# Patient Record
Sex: Female | Born: 1995
Health system: Southern US, Community
[De-identification: ages and names within clinical notes are randomized; demographics above are authoritative.]

## PROBLEM LIST (undated history)

## (undated) DIAGNOSIS — N946 Dysmenorrhea, unspecified: Secondary | ICD-10-CM

## (undated) DIAGNOSIS — L709 Acne, unspecified: Secondary | ICD-10-CM

## (undated) DIAGNOSIS — R011 Cardiac murmur, unspecified: Secondary | ICD-10-CM

## (undated) DIAGNOSIS — Z23 Encounter for immunization: Secondary | ICD-10-CM

## (undated) DIAGNOSIS — L309 Dermatitis, unspecified: Secondary | ICD-10-CM

## (undated) HISTORY — PX: NASAL SEPTUM SURGERY: SHX37

## (undated) HISTORY — DX: Dermatitis, unspecified: L30.9

## (undated) HISTORY — DX: Acne, unspecified: L70.9

## (undated) HISTORY — DX: Cardiac murmur, unspecified: R01.1

## (undated) HISTORY — DX: Encounter for immunization: Z23

## (undated) HISTORY — DX: Dysmenorrhea, unspecified: N94.6

---
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
## 2016-08-01 ENCOUNTER — Encounter: Payer: Self-pay | Admitting: Certified Nurse Midwife

## 2016-08-01 ENCOUNTER — Ambulatory Visit (INDEPENDENT_AMBULATORY_CARE_PROVIDER_SITE_OTHER): Payer: BLUE CROSS/BLUE SHIELD | Admitting: Certified Nurse Midwife

## 2016-08-01 VITALS — BP 128/70 | HR 106 | Ht 64.0 in | Wt 147.0 lb

## 2016-08-01 DIAGNOSIS — Z113 Encounter for screening for infections with a predominantly sexual mode of transmission: Secondary | ICD-10-CM | POA: Diagnosis not present

## 2016-08-01 DIAGNOSIS — Z975 Presence of (intrauterine) contraceptive device: Secondary | ICD-10-CM

## 2016-08-01 DIAGNOSIS — Z3049 Encounter for surveillance of other contraceptives: Secondary | ICD-10-CM

## 2016-08-01 DIAGNOSIS — Z01419 Encounter for gynecological examination (general) (routine) without abnormal findings: Secondary | ICD-10-CM | POA: Diagnosis not present

## 2016-08-01 NOTE — Progress Notes (Signed)
Gynecology Annual Exam  PCP: No PCP Per Patient  Chief Complaint:  Chief Complaint  Patient presents with  . Gynecologic Exam    History of Present Illness:   Wendy Garrett presents today for her annual exam. She is a 21 year old Caucasian/White female, G 0 P 0 0 0 0, whose LMP was . Her menses are absent She spots for one day irregularly since her Wendy Garrett IUD was inserted. 12/10/2015. She does have  Dysmenorrhea which is relieved with a heating pad or Aleve. The patient's past medical history is notable for a history of Cardiac Murmur and Dysmenorrhea. Since her last annual GYN exam dated 04/16/2015 , she has had no significant changes in her health history. She reports having to urinate frequently at night, but she drinks fluids up until she goes to bed. She has been having difficulty sleeping when she gets off working night shift.  She is not currently sexually active. She broke up with her boyfriend earlier this year.  She denies any history of STDs.  The patient has not had a pap smear due to her age.  Mammogram is not applicable. There is no family history of breast cancer. There is no family history of ovarian cancer. The patient does not do monthly self breast exams.  The patient does not smoke.  The patient does not drink alcohol.  The patient does not use illegal drugs.  The patient exercises occasionally. She has a BMI of 25.23kg/m2 (08/01/2016).  The patient does get adequate calcium in her diet.  She has completed the Gardasil vaccine series.      Review of Systems: Review of Systems  Constitutional: Negative for chills, fever and weight loss.       +weight gain  HENT: Negative for congestion, sinus pain and sore throat.   Eyes: Negative for blurred vision and pain.  Respiratory: Negative for hemoptysis, shortness of breath and wheezing.   Cardiovascular: Negative for chest pain, palpitations and leg swelling.  Gastrointestinal: Negative for abdominal pain, blood in  stool, diarrhea, heartburn, nausea and vomiting.  Genitourinary: Negative for dysuria, frequency, hematuria and urgency.       Positive for spotting irregularly, nocturia.  Musculoskeletal: Negative for back pain, joint pain and myalgias.  Skin: Negative for itching and rash.  Neurological: Negative for dizziness, tingling and headaches.  Endo/Heme/Allergies: Negative for environmental allergies and polydipsia. Does not bruise/bleed easily.       Negative for hirsutism   Psychiatric/Behavioral: Negative for depression. The patient has insomnia. The patient is not nervous/anxious.     Past Medical History:  Past Medical History:  Diagnosis Date  . Cardiac murmur   . Dysmenorrhea   . Vaccine for human papilloma virus (HPV) types 6, 11, 16, and 18 administered    series completed    Past Surgical History:  Past Surgical History:  Procedure Laterality Date  . NASAL SEPTUM SURGERY       Family History:  Family History  Problem Relation Age of Onset  . Bladder Cancer Maternal Grandmother   . Heart disease Maternal Grandfather   . Hypercholesterolemia Maternal Grandfather   . Hypertension Maternal Grandfather     Social History:  Social History   Social History  . Marital status: Single    Spouse name: N/A  . Number of children: N/A  . Years of education: N/A   Occupational History  . CNA    Social History Main Topics  . Smoking status: Never Smoker  .  Smokeless tobacco: Never Used  . Alcohol use No  . Drug use: No  . Sexual activity: Not Currently    Birth control/ protection: IUD   Other Topics Concern  . Not on file   Social History Narrative  . No narrative on file    Allergies:  No Known Allergies  Medications: Prior to Admission medications   Medication Sig Start Date End Date Taking? Authorizing Provider  Levonorgestrel (KYLEENA) 19.5 MG IUD by Intrauterine route.    Historical Provider, MD    Weight loss gummy  Physical Exam Vitals: Blood  pressure 128/70, pulse (!) 106, height  (1.626 m), weight 147 lb (66.7 kg), BMI 25.23 kg/m2, last menstrual period 07/23/2016.  General: WF in NAD, well nourished, well developed HEENT: normocephalic, anicteric Thyroid: no enlargement, no palpable nodules Pulmonary: No increased work of breathing, CTAB Cardiovascular: RRR without murmur Breast: Breast symmetrical, no tenderness, no palpable nodules or masses, no skin or nipple retraction present, no nipple discharge.  No axillary, infraclavicular or supraclavicular lymphadenopathy. Abdomen: soft, non-tender, non-distended.  Umbilicus without lesions.  No hepatomegaly or masses palpable. No evidence of hernia  Genitourinary:  External: Normal external female genitalia.  Normal urethral meatus, normal Bartholin's and Skene's glands.    Vagina: no lesions or tenderness    Cervix: no lesions, no bleeding, IUD strings present  Uterus: AF, NSSC, mobile, NT  Adnexa: ovaries non-enlarged, no adnexal masses  Rectal: deferred  Lymphatic: no evidence of inguinal lymphadenopathy Extremities: no edema, erythema, or tenderness Neurologic: Grossly intact Psychiatric: mood appropriate, affect full     Assessment: 21 y.o. well woman exam IUD in place  Plan:   1) Taught self breast exam. Recommend doing self breast exams monthly  2) STI screening was offered and accepted. Aptima done  3) Start Pap smears at age 78  4) Routine healthcare maintenance including cholesterol, diabetes screening discussed Declines   Farrel Conners, CNM

## 2016-08-02 ENCOUNTER — Encounter: Payer: Self-pay | Admitting: Certified Nurse Midwife

## 2016-08-02 DIAGNOSIS — Z975 Presence of (intrauterine) contraceptive device: Secondary | ICD-10-CM | POA: Insufficient documentation

## 2016-08-04 LAB — CHLAMYDIA/GONOCOCCUS/TRICHOMONAS, NAA
Chlamydia by NAA: NEGATIVE
GONOCOCCUS BY NAA: NEGATIVE
TRICH VAG BY NAA: NEGATIVE

## 2016-09-23 DIAGNOSIS — J301 Allergic rhinitis due to pollen: Secondary | ICD-10-CM | POA: Insufficient documentation

## 2016-10-03 ENCOUNTER — Ambulatory Visit (INDEPENDENT_AMBULATORY_CARE_PROVIDER_SITE_OTHER): Payer: BLUE CROSS/BLUE SHIELD | Admitting: Certified Nurse Midwife

## 2016-10-03 ENCOUNTER — Encounter: Payer: Self-pay | Admitting: Certified Nurse Midwife

## 2016-10-03 VITALS — BP 104/64 | HR 74 | Ht 65.0 in | Wt 154.0 lb

## 2016-10-03 DIAGNOSIS — Z3009 Encounter for other general counseling and advice on contraception: Secondary | ICD-10-CM | POA: Diagnosis not present

## 2016-10-03 DIAGNOSIS — Z113 Encounter for screening for infections with a predominantly sexual mode of transmission: Secondary | ICD-10-CM

## 2016-10-04 NOTE — Progress Notes (Signed)
Obstetrics & Gynecology Office Visit   Chief Complaint:  Chief Complaint  Patient presents with  . Contraception    desires IUD removal and new rx for OCP    History of Present Illness: 21 year old  Nulliparous WF who had a Skyla inserted August 2017 and presented originally to discuss removing the IUD because she was not having menses. She heard that not having menstrual cycles was not good for your health. Educated her about the progesterone IUD, why she is not having periods with it, and the safety of the OUD. She then decided to keep the IUD. She reports having intercourse without a condom recently and would like a STD check for Chlamydia and gonorrhea. She denies any problems with dysuria, vaginal discharge, or vulvar itching or lesions.    Review of Systems:  Review of Systems  Constitutional: Negative for chills and fever.  Genitourinary:       See HPI     Past Medical History:  Past Medical History:  Diagnosis Date  . Acne   . Cardiac murmur   . Dysmenorrhea   . Eczema   . Vaccine for human papilloma virus (HPV) types 6, 11, 16, and 18 administered    series completed    Past Surgical History:  Past Surgical History:  Procedure Laterality Date  . NASAL SEPTUM SURGERY      Gynecologic History: No LMP recorded. Patient is not currently having periods (Reason: IUD).    Family History:  Family History  Problem Relation Age of Onset  . Bladder Cancer Maternal Grandmother   . Heart disease Maternal Grandfather   . Hypercholesterolemia Maternal Grandfather   . Hypertension Maternal Grandfather     Social History:  Social History   Social History  . Marital status: Single    Spouse name: N/A  . Number of children: N/A  . Years of education: N/A   Occupational History  . CNA    Social History Main Topics  . Smoking status: Never Smoker  . Smokeless tobacco: Never Used  . Alcohol use No  . Drug use: No  . Sexual activity: Not Currently    Birth  control/ protection: IUD   Other Topics Concern  . Not on file   Social History Narrative  . No narrative on file    Allergies:  No Known Allergies  Medications: Prior to Admission medications   Medication Sig Start Date End Date Taking? Authorizing Provider  Levonorgestrel (KYLEENA) 19.5 MG IUD by Intrauterine route.    [provider]  ONEXTON 1.2-3.75 % GEL  09/30/16   [provider]  RETIN-A MICRO PUMP 0.06 % GEL  09/30/16   [provider]  triamcinolone (KENALOG) 0.025 % cream  09/30/16   [provider]    Physical Exam Vitals:  Vitals:   10/03/16 1124  BP: 104/64  Pulse: 74   No LMP recorded. Patient is not currently having periods (Reason: IUD).  Physical Exam  Constitutional: She is oriented to person, place, and time. She appears well-developed and well-nourished. No distress.  GI: Soft. She exhibits no distension. There is no tenderness.  Genitourinary:  Genitourinary Comments: Vulva: no lesions or inflammation Vagina: small amount white discharge without odor Cervix: IUD strings palpable, closed, NT  Neurological: She is alert and oriented to person, place, and time.  Skin: Skin is warm and dry.  Psychiatric: She has a normal mood and affect. Her behavior is normal. Thought content normal.  Assessment: 21 y.o. contraception management/ education STD screen  Plan: Gc/Chlamydia NAAT RTO for annual and prn Encourage condom use with intercourse Offered HIV and RPR testing and she declined  Farrel Connersolleen Rochelle Nephew, CNM

## 2016-10-07 LAB — GC/CHLAMYDIA PROBE AMP
CHLAMYDIA, DNA PROBE: NEGATIVE
Neisseria gonorrhoeae by PCR: NEGATIVE

## 2016-11-26 ENCOUNTER — Ambulatory Visit (INDEPENDENT_AMBULATORY_CARE_PROVIDER_SITE_OTHER): Payer: BLUE CROSS/BLUE SHIELD | Admitting: Advanced Practice Midwife

## 2016-11-26 ENCOUNTER — Encounter: Payer: Self-pay | Admitting: Advanced Practice Midwife

## 2016-11-26 VITALS — BP 108/68 | HR 93 | Ht 64.0 in | Wt 150.0 lb

## 2016-11-26 DIAGNOSIS — Z30432 Encounter for removal of intrauterine contraceptive device: Secondary | ICD-10-CM | POA: Diagnosis not present

## 2016-11-26 DIAGNOSIS — Z30011 Encounter for initial prescription of contraceptive pills: Secondary | ICD-10-CM

## 2016-11-26 MED ORDER — NORETHIN ACE-ETH ESTRAD-FE 1-20 MG-MCG PO TABS: 1.0000 | ORAL_TABLET | Freq: Every day | ORAL | 5 refills | Status: DC

## 2016-11-26 NOTE — Progress Notes (Signed)
        GYNECOLOGY OFFICE PROCEDURE NOTE  Wendy Garrett is a 21 y.o. G0P0000 here for IUD removal. The patient currently has a [place]yleena IUD placed August 2017. She states that she has had pelvic and back pain since the insertion of the IUD. She was seen here 2 months ago and decided to keep the IUD at that time, but her pain has continued. She would like to take OCPs instead.   Vital Signs: BP 108/68   Pulse 93   Ht 5\' 4"  (1.626 m)   Wt 150 lb (68 kg)   BMI 25.75 kg/m  Constitutional: Well nourished, well developed female in no acute distress.  HEENT: normal Skin: Warm and dry.  Cardiovascular: Regular rate and rhythm.   Respiratory: Clear to auscultation bilateral. Normal respiratory effort Psych: Alert and Oriented x3. No memory deficits. Normal mood and affect.  MS: normal gait, normal bilateral lower extremity ROM/strength/stability.  Pelvic exam:  is not limited by body habitus EGBUS: within normal limits Vagina: within normal limits and with normal mucosa  Cervix: normal appearance  IUD Removal and Reinsertion  Patient identified, informed consent performed, verbal consent obtained. Time out was performed. Speculum placed in the vagina. The strings of the IUD were grasped and pulled using ring forceps. The IUD was successfully removed in its entirety.  Patient tolerated procedure well.   Plan: Contraception: Oral combined pill Rx for 6 months until next annual due in January. Follow up as needed  Tresea MallJane Darlena Koval, CNM

## 2017-04-03 ENCOUNTER — Telehealth: Payer: Self-pay

## 2017-04-03 DIAGNOSIS — Z30011 Encounter for initial prescription of contraceptive pills: Secondary | ICD-10-CM

## 2017-04-03 MED ORDER — NORETHIN ACE-ETH ESTRAD-FE 1-20 MG-MCG PO TABS: 1.0000 | ORAL_TABLET | Freq: Every day | ORAL | 0 refills | Status: DC

## 2017-04-03 NOTE — Telephone Encounter (Signed)
Verified w/pharmacy that they filled pt's last rf yesterday & it is ready for her to p/u

## 2017-04-03 NOTE — Telephone Encounter (Signed)
Pt needs refill of OCP sent to [facility]. [phone]#[phone]

## 2017-04-03 NOTE — Telephone Encounter (Signed)
Spoke w/pt to relay msg below regarding rx being ready for p/u at pharmacy. Pt states her AE is 05/12/17 w/RPH & she will need another rf just prior to that visit. Notified will send an additional rf.

## 2017-04-15 ENCOUNTER — Other Ambulatory Visit: Payer: Self-pay | Admitting: Advanced Practice Midwife

## 2017-04-15 DIAGNOSIS — Z30011 Encounter for initial prescription of contraceptive pills: Secondary | ICD-10-CM

## 2017-05-12 ENCOUNTER — Encounter: Payer: Self-pay | Admitting: Obstetrics & Gynecology

## 2017-05-12 ENCOUNTER — Ambulatory Visit (INDEPENDENT_AMBULATORY_CARE_PROVIDER_SITE_OTHER): Payer: BLUE CROSS/BLUE SHIELD | Admitting: Obstetrics & Gynecology

## 2017-05-12 VITALS — BP 120/80 | HR 84 | Ht 63.0 in | Wt 150.0 lb

## 2017-05-12 DIAGNOSIS — Z01419 Encounter for gynecological examination (general) (routine) without abnormal findings: Secondary | ICD-10-CM

## 2017-05-12 DIAGNOSIS — Z118 Encounter for screening for other infectious and parasitic diseases: Secondary | ICD-10-CM | POA: Diagnosis not present

## 2017-05-12 DIAGNOSIS — Z0000 Encounter for general adult medical examination without abnormal findings: Secondary | ICD-10-CM

## 2017-05-12 DIAGNOSIS — Z30011 Encounter for initial prescription of contraceptive pills: Secondary | ICD-10-CM | POA: Diagnosis not present

## 2017-05-12 MED ORDER — NORETHIN ACE-ETH ESTRAD-FE 1-20 MG-MCG PO TABS
1.0000 | ORAL_TABLET | Freq: Every day | ORAL | 3 refills | Status: DC
Start: 2017-05-12 — End: 2018-01-06

## 2017-05-12 NOTE — Progress Notes (Signed)
HPI:      Wendy Garrett is a 22 y.o. G0P0000 who LMP was Patient's last menstrual period was 04/28/2017., she presents today for her annual examination. The patient has no complaints today. The patient is sexually active. Her past history is neg for STD (prior testing neg).  No prior PAP.  Has had Gardasil.. The patient does not perform self breast exams.  There is no notable family history of breast or ovarian cancer in her family.  The patient has regular exercise: yes.  The patient denies current symptoms of depression.  Has done well w OCP for past year, reg 3 day cycles.  IUD caused discomfort in past, had removed last year.  GYN History: Contraception: OCP (estrogen/progesterone)  PMHx: Past Medical History:  Diagnosis Date  . Acne   . Cardiac murmur   . Dysmenorrhea   . Eczema   . Vaccine for human papilloma virus (HPV) types 6, 11, 16, and 18 administered    series completed   Past Surgical History:  Procedure Laterality Date  . NASAL SEPTUM SURGERY     Family History  Problem Relation Age of Onset  . Bladder Cancer Maternal Grandmother   . Heart disease Maternal Grandfather   . Hypercholesterolemia Maternal Grandfather   . Hypertension Maternal Grandfather    Social History   Tobacco Use  . Smoking status: Never Smoker  . Smokeless tobacco: Never Used  Substance Use Topics  . Alcohol use: No  . Drug use: No    Current Outpatient Medications:  .  norethindrone-ethinyl estradiol (JUNEL FE 1/20) 1-20 MG-MCG tablet, Take 1 tablet by mouth daily., Disp: 3 Package, Rfl: 3 .  TRIAMCINOLONE & EMOLLIENT EX, APPLY TO AFFECTED AREA TWICE A DAY, Disp: , Rfl: 0 .  triamcinolone (KENALOG) 0.025 % cream, , Disp: , Rfl:  Allergies: Patient has no known allergies.  Review of Systems  Constitutional: Negative for chills, fever and malaise/fatigue.  HENT: Negative for congestion, sinus pain and sore throat.   Eyes: Negative for blurred vision and pain.  Respiratory:  Negative for cough and wheezing.   Cardiovascular: Negative for chest pain and leg swelling.  Gastrointestinal: Negative for abdominal pain, constipation, diarrhea, heartburn, nausea and vomiting.  Genitourinary: Negative for dysuria, frequency, hematuria and urgency.  Musculoskeletal: Negative for back pain, joint pain, myalgias and neck pain.  Skin: Negative for itching and rash.  Neurological: Negative for dizziness, tremors and weakness.  Endo/Heme/Allergies: Does not bruise/bleed easily.  Psychiatric/Behavioral: Negative for depression. The patient is not nervous/anxious and does not have insomnia.     Objective: BP 120/80   Pulse 84   Ht 5\' 3"  (1.6 m)   Wt 150 lb (68 kg)   LMP 04/28/2017   BMI 26.57 kg/m   Filed Weights   05/12/17 0848  Weight: 150 lb (68 kg)   Body mass index is 26.57 kg/m. Physical Exam  Constitutional: She is oriented to person, place, and time. She appears well-developed and well-nourished. No distress.  Genitourinary: Rectum normal, vagina normal and uterus normal. Pelvic exam was performed with patient supine. There is no rash or lesion on the right labia. There is no rash or lesion on the left labia. Vagina exhibits no lesion. No bleeding in the vagina. Right adnexum does not display mass and does not display tenderness. Left adnexum does not display mass and does not display tenderness. Cervix does not exhibit motion tenderness, lesion, friability or polyp.   Uterus is mobile and midaxial. Uterus  is not enlarged or exhibiting a mass.  HENT:  Head: Normocephalic and atraumatic. Head is without laceration.  Right Ear: Hearing normal.  Left Ear: Hearing normal.  Nose: No epistaxis.  No foreign bodies.  Mouth/Throat: Uvula is midline, oropharynx is clear and moist and mucous membranes are normal.  Eyes: Pupils are equal, round, and reactive to light.  Neck: Normal range of motion. Neck supple. No thyromegaly present.  Cardiovascular: Normal rate and  regular rhythm. Exam reveals no gallop and no friction rub.  No murmur heard. Pulmonary/Chest: Effort normal and breath sounds normal. No respiratory distress. She has no wheezes. Right breast exhibits no mass, no skin change and no tenderness. Left breast exhibits no mass, no skin change and no tenderness.  Abdominal: Soft. Bowel sounds are normal. She exhibits no distension. There is no tenderness. There is no rebound.  Musculoskeletal: Normal range of motion.  Neurological: She is alert and oriented to person, place, and time. No cranial nerve deficit.  Skin: Skin is warm and dry.  Psychiatric: She has a normal mood and affect. Judgment normal.  Vitals reviewed.   Assessment:  ANNUAL EXAM 1. Annual physical exam   2. Screening for chlamydial disease   3. Encounter for initial prescription of contraceptive pills    Screening Plan:            1.  Cervical Screening-  Pap smear schedule reviewed with patient  2. Breast screening- Exam annually and mammogram>40 planned   3. Colonoscopy every 10 years, Hemoccult testing - after age 21  4. Labs Declines  5. Counseling for contraception: oral contraceptives (estrogen/progesterone)  - norethindrone-ethinyl estradiol (JUNEL FE 1/20) 1-20 MG-MCG tablet; Take 1 tablet by mouth daily.  Dispense: 3 Package; Refill: 3   6. Screen for GC/Chl today    F/U  Return in about 1 year (around 05/12/2018) for Annual.  Annamarie MajorPaul Harris, MD, Merlinda FrederickFACOG [facility], [facility] 05/12/2017  9:06 AM

## 2017-05-12 NOTE — Patient Instructions (Signed)
Ethinyl Estradiol; Norethindrone Acetate tablets (contraception) What is this medicine? ETHINYL ESTRADIOL; NORETHINDRONE ACETATE (ETH in il es tra DYE ole; nor eth IN drone AS e tate) is an oral contraceptive. The products combine two types of female hormones, an estrogen and a progestin. They are used to prevent ovulation and pregnancy. This medicine may be used for other purposes; ask your health care provider or pharmacist if you have questions. COMMON BRAND NAME(S): Gildess, Junel 1.5/30, Junel 1/20, LARIN, Loestrin 1.5/30, Loestrin 1/20, Microgestin 1.5/30, Microgestin 1/20 What should I tell my health care provider before I take this medicine? They need to know if you have or ever had any of these conditions: -abnormal vaginal bleeding -blood vessel disease or blood clots -breast, cervical, endometrial, ovarian, liver, or uterine cancer -diabetes -gallbladder disease -heart disease or recent heart attack -high blood pressure -high cholesterol -kidney disease -liver disease -migraine headaches -stroke -systemic lupus erythematosus (SLE) -tobacco smoker -an unusual or allergic reaction to estrogens, progestins, other medicines, foods, dyes, or preservatives -pregnant or trying to get pregnant -breast-feeding How should I use this medicine? Take this medicine by mouth. To reduce nausea, this medicine may be taken with food. Follow the directions on the prescription label. Take this medicine at the same time each day and in the order directed on the package. Do not take your medicine more often than directed. Contact your pediatrician regarding the use of this medicine in children. Special care may be needed. This medicine has been used in female children who have started having menstrual periods. A patient package insert for the product will be given with each prescription and refill. Read this sheet carefully each time. The sheet may change frequently. Overdosage: If you think you  have taken too much of this medicine contact a poison control center or emergency room at once. NOTE: This medicine is only for you. Do not share this medicine with others. What if I miss a dose? If you miss a dose, refer to the patient information sheet you received with your medicine for direction. If you miss more than one pill, this medicine may not be as effective and you may need to use another form of birth control. What may interact with this medicine? Do not take this medicine with the following medication: -dasabuvir; ombitasvir; paritaprevir; ritonavir -ombitasvir; paritaprevir; ritonavir This medicine may also interact with the following medications: -acetaminophen -antibiotics or medicines for infections, especially rifampin, rifabutin, rifapentine, and griseofulvin, and possibly penicillins or tetracyclines -aprepitant -ascorbic acid (vitamin C) -atorvastatin -barbiturate medicines, such as phenobarbital -bosentan -carbamazepine -caffeine -clofibrate -cyclosporine -dantrolene -doxercalciferol -felbamate -grapefruit juice -hydrocortisone -medicines for anxiety or sleeping problems, such as diazepam or temazepam -medicines for diabetes, including pioglitazone -mineral oil -modafinil -mycophenolate -nefazodone -oxcarbazepine -phenytoin -prednisolone -ritonavir or other medicines for HIV infection or AIDS -rosuvastatin -selegiline -soy isoflavones supplements -St. John's wort -tamoxifen or raloxifene -theophylline -thyroid hormones -topiramate -warfarin This list may not describe all possible interactions. Give your health care provider a list of all the medicines, herbs, non-prescription drugs, or dietary supplements you use. Also tell them if you smoke, drink alcohol, or use illegal drugs. Some items may interact with your medicine. What should I watch for while using this medicine? Visit your doctor or health care professional for regular checks on your  progress. You will need a regular breast and pelvic exam and Pap smear while on this medicine. Use an additional method of contraception during the first cycle that you take these tablets. If you have any   reason to think you are pregnant, stop taking this medicine right away and contact your doctor or health care professional. If you are taking this medicine for hormone related problems, it may take several cycles of use to see improvement in your condition. Smoking increases the risk of getting a blood clot or having a stroke while you are taking birth control pills, especially if you are more than 21 years old. You are strongly advised not to smoke. This medicine can make your body retain fluid, making your fingers, hands, or ankles swell. Your blood pressure can go up. Contact your doctor or health care professional if you feel you are retaining fluid. This medicine can make you more sensitive to the sun. Keep out of the sun. If you cannot avoid being in the sun, wear protective clothing and use sunscreen. Do not use sun lamps or tanning beds/booths. If you wear contact lenses and notice visual changes, or if the lenses begin to feel uncomfortable, consult your eye care specialist. In some women, tenderness, swelling, or minor bleeding of the gums may occur. Notify your dentist if this happens. Brushing and flossing your teeth regularly may help limit this. See your dentist regularly and inform your dentist of the medicines you are taking. If you are going to have elective surgery, you may need to stop taking this medicine before the surgery. Consult your health care professional for advice. This medicine does not protect you against HIV infection (AIDS) or any other sexually transmitted diseases. What side effects may I notice from receiving this medicine? Side effects that you should report to your doctor or health care professional as soon as possible: -breast tissue changes or discharge -changes  in vaginal bleeding during your period or between your periods -chest pain -coughing up blood -dizziness or fainting spells -headaches or migraines -leg, arm or groin pain -severe or sudden headaches -stomach pain (severe) -sudden shortness of breath -sudden loss of coordination, especially on one side of the body -speech problems -symptoms of vaginal infection like itching, irritation or unusual discharge -tenderness in the upper abdomen -vomiting -weakness or numbness in the arms or legs, especially on one side of the body -yellowing of the eyes or skin Side effects that usually do not require medical attention (report to your doctor or health care professional if they continue or are bothersome): -breakthrough bleeding and spotting that continues beyond the 3 initial cycles of pills -breast tenderness -mood changes, anxiety, depression, frustration, anger, or emotional outbursts -increased sensitivity to sun or ultraviolet light -nausea -skin rash, acne, or brown spots on the skin -weight gain (slight) This list may not describe all possible side effects. [place] your doctor for medical advice about side effects. You may report side effects to FDA at 1-800-FDA-1088. Where should I keep my medicine? Keep out of the reach of children. Store at room temperature between 15 and 30 degrees C (59 and 86 degrees F). Throw away any unused medicine after the expiration date. NOTE: This sheet is a summary. It may not cover all possible information. If you have questions about this medicine, talk to your doctor, pharmacist, or health care provider.  2018 Elsevier/Gold Standard (2015-12-10 08:02:50)  

## 2017-05-15 LAB — GC/CHLAMYDIA PROBE AMP
CHLAMYDIA, DNA PROBE: NEGATIVE
NEISSERIA GONORRHOEAE BY PCR: NEGATIVE

## 2018-01-04 ENCOUNTER — Telehealth: Payer: Self-pay

## 2018-01-04 NOTE — Telephone Encounter (Signed)
Pt did Ouch her pharmacist who sugg PH could switch out the hormones in bc to see if that's related to ezema.  [phone]

## 2018-01-04 NOTE — Telephone Encounter (Signed)
Pt is on Junel FE and has had a lot of ezema flare-ups.  Wonders if there is a correlation to the bcp.  [phone]  Left detailed msg that I haven't heard of that.  Adv to contact pharmacist.

## 2018-01-06 ENCOUNTER — Other Ambulatory Visit: Payer: Self-pay | Admitting: Obstetrics & Gynecology

## 2018-01-06 MED ORDER — DROSPIRENONE-ETHINYL ESTRADIOL 3-0.02 MG PO TABS
1.0000 | ORAL_TABLET | Freq: Every day | ORAL | 3 refills | Status: DC
Start: 2018-01-06 — End: 2018-03-04

## 2018-01-06 NOTE — Telephone Encounter (Signed)
Left message to make pt aware of rx change

## 2018-01-06 NOTE — Telephone Encounter (Signed)
Rx changed to Yaz to see if does better w skin/

## 2018-03-01 ENCOUNTER — Telehealth: Payer: Self-pay

## 2018-03-01 NOTE — Telephone Encounter (Signed)
Pt has chosed not to use the bc that was rx'd last.  Please refill the previous one.  [phone]

## 2018-03-03 NOTE — Telephone Encounter (Signed)
Pt wanting to go back to Junel Fe OCP. Please advise

## 2018-03-03 NOTE — Telephone Encounter (Signed)
Please rx

## 2018-03-04 ENCOUNTER — Other Ambulatory Visit: Payer: Self-pay | Admitting: Obstetrics & Gynecology

## 2018-03-04 MED ORDER — NORETHIN ACE-ETH ESTRAD-FE 1-20 MG-MCG PO TABS: 1.0000 | ORAL_TABLET | Freq: Every day | ORAL | 2 refills | Status: DC

## 2018-03-04 NOTE — Telephone Encounter (Signed)
ERx done Junel.  She needs Annual in January w PAP (her first) so refills will get her til Jan 2020.

## 2018-03-04 NOTE — Telephone Encounter (Signed)
Pt aware she needs a annual in January, she is also aware you will be calling her to schedule ,

## 2018-03-04 NOTE — Telephone Encounter (Signed)
[facility]

## 2018-03-15 DIAGNOSIS — Z0184 Encounter for antibody response examination: Secondary | ICD-10-CM | POA: Diagnosis not present

## 2018-05-14 ENCOUNTER — Ambulatory Visit (INDEPENDENT_AMBULATORY_CARE_PROVIDER_SITE_OTHER): Payer: BLUE CROSS/BLUE SHIELD | Admitting: Obstetrics & Gynecology

## 2018-05-14 ENCOUNTER — Encounter: Payer: Self-pay | Admitting: Obstetrics & Gynecology

## 2018-05-14 ENCOUNTER — Other Ambulatory Visit (HOSPITAL_COMMUNITY)
Admission: RE | Admit: 2018-05-14 | Discharge: 2018-05-14 | Disposition: A | Discharge status: Home / Self Care | Payer: BLUE CROSS/BLUE SHIELD | Source: Ambulatory Visit | Attending: Obstetrics & Gynecology | Admitting: Obstetrics & Gynecology

## 2018-05-14 VITALS — BP 120/80 | Ht 64.0 in | Wt 152.0 lb

## 2018-05-14 DIAGNOSIS — Z124 Encounter for screening for malignant neoplasm of cervix: Secondary | ICD-10-CM | POA: Diagnosis not present

## 2018-05-14 DIAGNOSIS — Z01419 Encounter for gynecological examination (general) (routine) without abnormal findings: Secondary | ICD-10-CM | POA: Diagnosis not present

## 2018-05-14 DIAGNOSIS — Z113 Encounter for screening for infections with a predominantly sexual mode of transmission: Secondary | ICD-10-CM

## 2018-05-14 MED ORDER — NORETHIN ACE-ETH ESTRAD-FE 1-20 MG-MCG PO TABS: 1.0000 | ORAL_TABLET | Freq: Every day | ORAL | 3 refills | Status: DC

## 2018-05-14 NOTE — Progress Notes (Signed)
HPI:      Wendy Garrett is a 23 y.o. G0P0000 who LMP was Patient's last menstrual period was 04/14/2018., she presents today for her annual examination. The patient has no complaints today. The patient is not sexually active. Her no prior history of gyn screening tests. The patient does perform self breast exams.  There is no notable family history of breast or ovarian cancer in her family.  The patient has regular exercise: yes.  The patient denies current symptoms of depression.  Reg, 3d periods.  Stress w school especially right now, occas has panic attacks.  GYN History: Contraception: OCP (estrogen/progesterone)  PMHx: Past Medical History:  Diagnosis Date  . Acne   . Cardiac murmur   . Dysmenorrhea   . Eczema   . Vaccine for human papilloma virus (HPV) types 6, 11, 16, and 18 administered    series completed   Past Surgical History:  Procedure Laterality Date  . NASAL SEPTUM SURGERY     Family History  Problem Relation Age of Onset  . Bladder Cancer Maternal Grandmother   . Heart disease Maternal Grandfather   . Hypercholesterolemia Maternal Grandfather   . Hypertension Maternal Grandfather    Social History   Tobacco Use  . Smoking status: Never Smoker  . Smokeless tobacco: Never Used  Substance Use Topics  . Alcohol use: No  . Drug use: No    Current Outpatient Medications:  .  norethindrone-ethinyl estradiol (JUNEL FE 1/20) 1-20 MG-MCG tablet, Take 1 tablet by mouth daily., Disp: 3 Package, Rfl: 3 .  TRIAMCINOLONE & EMOLLIENT EX, APPLY TO AFFECTED AREA TWICE A DAY, Disp: , Rfl: 0 .  triamcinolone (KENALOG) 0.025 % cream, , Disp: , Rfl:  Allergies: Patient has no known allergies.  Review of Systems  Constitutional: Negative for chills, fever and malaise/fatigue.  HENT: Negative for congestion, sinus pain and sore throat.   Eyes: Negative for blurred vision and pain.  Respiratory: Negative for cough and wheezing.   Cardiovascular: Negative for chest pain  and leg swelling.  Gastrointestinal: Negative for abdominal pain, constipation, diarrhea, heartburn, nausea and vomiting.  Genitourinary: Negative for dysuria, frequency, hematuria and urgency.  Musculoskeletal: Negative for back pain, joint pain, myalgias and neck pain.  Skin: Negative for itching and rash.  Neurological: Negative for dizziness, tremors and weakness.  Endo/Heme/Allergies: Does not bruise/bleed easily.  Psychiatric/Behavioral: Negative for depression. The patient is nervous/anxious. The patient does not have insomnia.     Objective: BP 120/80   Ht 5\' 4"  (1.626 m)   Wt 152 lb (68.9 kg)   LMP 04/14/2018   BMI 26.09 kg/m   Filed Weights   05/14/18 0901  Weight: 152 lb (68.9 kg)   Body mass index is 26.09 kg/m. Physical Exam Constitutional:      General: She is not in acute distress.    Appearance: She is well-developed.  Genitourinary:     Pelvic exam was performed with patient supine.     Vagina, uterus and rectum normal.     No lesions in the vagina.     No vaginal bleeding.     No cervical motion tenderness, friability, lesion or polyp.     Uterus is mobile.     Uterus is not enlarged.     No uterine mass detected.    Uterus is midaxial.     No right or left adnexal mass present.     Right adnexa not tender.     Left adnexa  not tender.  HENT:     Head: Normocephalic and atraumatic. No laceration.     Right Ear: Hearing normal.     Left Ear: Hearing normal.     Mouth/Throat:     Pharynx: Uvula midline.  Eyes:     Pupils: Pupils are equal, round, and reactive to light.  Neck:     Musculoskeletal: Normal range of motion and neck supple.     Thyroid: No thyromegaly.  Cardiovascular:     Rate and Rhythm: Normal rate and regular rhythm.     Heart sounds: No murmur. No friction rub. No gallop.   Pulmonary:     Effort: Pulmonary effort is normal. No respiratory distress.     Breath sounds: Normal breath sounds. No wheezing.  Chest:     Breasts:         Right: No mass, skin change or tenderness.        Left: No mass, skin change or tenderness.  Abdominal:     General: Bowel sounds are normal. There is no distension.     Palpations: Abdomen is soft.     Tenderness: There is no abdominal tenderness. There is no rebound.  Musculoskeletal: Normal range of motion.  Neurological:     Mental Status: She is alert and oriented to person, place, and time.     Cranial Nerves: No cranial nerve deficit.  Skin:    General: Skin is warm and dry.  Psychiatric:        Judgment: Judgment normal.  Vitals signs reviewed.     Assessment:  ANNUAL EXAM 1. Women's annual routine gynecological examination   2. Screening for cervical cancer   3. Screen for STD (sexually transmitted disease)    Screening Plan:            1.  Cervical Screening-  Pap smear done today  2. Breast screening- Exam annually and mammogram>40 planned   3. Colonoscopy every 10 years, Hemoccult testing - after age 16  4. Labs not indicated  5. Counseling for contraception: oral contraceptives (estrogen/progesterone)  6. Stress, some anxiety and panic attacks, school related ([employer]).  Monitor, address external stressors and factors. Consideration for Buspar, Wellbutrin type therapies discussed if persisant despite relaxation and de-stressifying measures    F/U  Return in about 1 year (around 05/15/2019) for Annual.  Annamarie Major, MD, [facility], [facility] 05/14/2018  9:08 AM

## 2018-05-14 NOTE — Patient Instructions (Signed)
Pap Test  Why am I having this test?  A Pap test, also called a Pap smear, is a screening test to check for signs of:  · Cancer of the vagina, cervix, and uterus. The cervix is the lower part of the uterus that opens into the vagina.  · Infection.  · Changes that may be a sign that cancer is developing (precancerous changes).  Women need this test on a regular basis. In general, you should have a Pap test every 3 years until you reach menopause or age 22. Women aged 30-60 may choose to have their Pap test done at the same time as an HPV (human papillomavirus) test every 5 years (instead of every 3 years).  Your health care provider may recommend having Pap tests more or less often depending on your medical conditions and past Pap test results.  What kind of sample is taken?    Your health care provider will collect a sample of cells from the surface of your cervix. This will be done using a small cotton swab, plastic spatula, or brush. This sample is often collected during a pelvic exam, when you are lying on your back on an exam table with feet in footrests (stirrups).  In some cases, fluids (secretions) from the cervix or vagina may also be collected.  How do I prepare for this test?  · Be aware of where you are in your menstrual cycle. If you are menstruating on the day of the test, you may be asked to reschedule.  · You may need to reschedule if you have a known vaginal infection on the day of the test.  · Follow instructions from your health care provider about:  ? Changing or stopping your regular medicines. Some medicines can cause abnormal test results, such as digitalis and tetracycline.  ? Avoiding douching or taking a bath the day before or the day of the test.  Tell a health care provider about:  · Any allergies you have.  · All medicines you are taking, including vitamins, herbs, eye drops, creams, and over-the-counter medicines.  · Any blood disorders you have.  · Any surgeries you have had.  · Any  medical conditions you have.  · Whether you are pregnant or may be pregnant.  How are the results reported?  Your test results will be reported as either abnormal or normal.  A false-positive result can occur. A false positive is incorrect because it means that a condition is present when it is not.  A false-negative result can occur. A false negative is incorrect because it means that a condition is not present when it is.  What do the results mean?  A normal test result means that you do not have signs of cancer of the vagina, cervix, or uterus.  An abnormal result may mean that you have:  · Cancer. A Pap test by itself is not enough to diagnose cancer. You will have more tests done in this case.  · Precancerous changes in your vagina, cervix, or uterus.  · Inflammation of the cervix.  · An STD (sexually transmitted disease).  · A fungal infection.  · A parasite infection.  Talk with your health care provider about what your results mean.  Questions to ask your health care provider  Ask your health care provider, or the department that is doing the test:  · When will my results be ready?  · How will I get my results?  · What are my   treatment options?  · What other tests do I need?  · What are my next steps?  Summary  · In general, women should have a Pap test every 3 years until they reach menopause or age 22.  · Your health care provider will collect a sample of cells from the surface of your cervix. This will be done using a small cotton swab, plastic spatula, or brush.  · In some cases, fluids (secretions) from the cervix or vagina may also be collected.  This information is not intended to replace advice given to you by your health care provider. Make sure you discuss any questions you have with your health care provider.  Document Released: 06/21/2002 Document Revised: 12/08/2016 Document Reviewed: 12/08/2016  Elsevier Interactive Patient Education © 2019 Elsevier Inc.

## 2018-05-17 LAB — CYTOLOGY - PAP
Chlamydia: NEGATIVE
Diagnosis: NEGATIVE
Neisseria Gonorrhea: NEGATIVE
TRICH (WINDOWPATH): NEGATIVE

## 2018-07-08 ENCOUNTER — Telehealth: Payer: Self-pay

## 2018-07-08 NOTE — Telephone Encounter (Signed)
Sch telephone appt tomorrow to discuss and prescribe

## 2018-07-08 NOTE — Telephone Encounter (Signed)
Patient is schedule 07/09/18 at 11:40

## 2018-07-08 NOTE — Telephone Encounter (Signed)
Pt states at her previous visit [facility] told her if her anxiety had gotten worse he could prescribe anxiety medication. Pt states it has gotten worse also experiencing depression.

## 2018-07-09 ENCOUNTER — Encounter: Payer: Self-pay | Admitting: Obstetrics & Gynecology

## 2018-07-09 ENCOUNTER — Other Ambulatory Visit: Payer: Self-pay

## 2018-07-09 ENCOUNTER — Ambulatory Visit (INDEPENDENT_AMBULATORY_CARE_PROVIDER_SITE_OTHER): Payer: BLUE CROSS/BLUE SHIELD | Admitting: Obstetrics & Gynecology

## 2018-07-09 DIAGNOSIS — F418 Other specified anxiety disorders: Secondary | ICD-10-CM

## 2018-07-09 MED ORDER — BUPROPION HCL ER (XL) 150 MG PO TB24: 150.0000 mg | ORAL_TABLET | Freq: Every day | ORAL | 6 refills | Status: DC

## 2018-07-09 NOTE — Progress Notes (Signed)
Virtual Visit via Telephone Note  I connected with Wendy Garrett on 07/09/18 at 11:40 AM EDT by telephone and verified that I am speaking with the correct person using two identifiers.   I discussed the limitations, risks, security and privacy concerns of performing an evaluation and management service by telephone and the availability of in person appointments. I also discussed with the patient that there may be a patient responsible charge related to this service. The patient expressed understanding and agreed to proceed. She was at home and I was in my office.   History of Present Illness: Pt is a 23 yo female G0 with worsening anxiety and depressive sx's over the last year, worse these last few weeks; attributed some to the [place] virus outbreak and change in school and lifestyle habits due to it. Reports one episode of panic.  Reports staying in bed longer, less desire to get out of bed, decrease in conversation or desiring to converse w others; crying spells.  She lives with parents.  In school but this is online now.  Denies SI, HI.  No recent meds or counseling.   Observations/Objective: No exam today, due to telephone eVisit due to [facility] virus restriction on elective visits and procedures.  Prior visits reviewed along with ultrasounds/labs as indicated.  Assessment and Plan: 1. Depression with anxiety - Pros and cons of medical therapy d/w pt, including risks and benefits of Wellbutrin therapy. - buPROPion (WELLBUTRIN XL) 150 MG 24 hr tablet; Take 1 tablet (150 mg total) by mouth daily.  Dispense: 30 tablet; Refill: 6 - counseling recommended  Follow Up Instructions: - 2 mos follow up   I discussed the assessment and treatment plan with the patient. The patient was provided an opportunity to ask questions and all were answered. The patient agreed with the plan and demonstrated an understanding of the instructions.   The patient was advised to [place] back or seek an in-person  evaluation if the symptoms worsen or if the condition fails to improve as anticipated.  I provided 15 minutes of non-face-to-face time during this encounter.   Wendy Libra, MD [facility], [facility] 07/09/2018  11:52 AM

## 2018-07-31 ENCOUNTER — Other Ambulatory Visit: Payer: Self-pay | Admitting: Obstetrics & Gynecology

## 2018-07-31 DIAGNOSIS — F418 Other specified anxiety disorders: Secondary | ICD-10-CM

## 2018-09-09 ENCOUNTER — Other Ambulatory Visit: Payer: Self-pay

## 2018-09-09 ENCOUNTER — Encounter: Payer: Self-pay | Admitting: Obstetrics & Gynecology

## 2018-09-09 ENCOUNTER — Ambulatory Visit (INDEPENDENT_AMBULATORY_CARE_PROVIDER_SITE_OTHER): Payer: BLUE CROSS/BLUE SHIELD | Admitting: Obstetrics & Gynecology

## 2018-09-09 VITALS — Ht 64.0 in | Wt 146.0 lb

## 2018-09-09 DIAGNOSIS — F418 Other specified anxiety disorders: Secondary | ICD-10-CM

## 2018-09-09 NOTE — Progress Notes (Signed)
Virtual Visit via Telephone Note  I connected with Wendy Garrett on 09/09/18 at 10:00 AM EDT by telephone and verified that I am speaking with the correct person using two identifiers.   I discussed the limitations, risks, security and privacy concerns of performing an evaluation and management service by telephone and the availability of in person appointments. I also discussed with the patient that there may be a patient responsible charge related to this service. The patient expressed understanding and agreed to proceed.  She was at home and I was in my office.  History of Present Illness:  Wendy Garrett is a 23 y.o. who was started on Wellbutrin approximately 2 months ago. Since that time, she states that her symptoms show no change.  She feels better from her depression and anxiety, but feels this is more from behavioral modifications than from the medicine.  No side effects she is aware of.  Denies SI, HI.  PMHx: She  has a past medical history of Acne, Cardiac murmur, Dysmenorrhea, Eczema, and Vaccine for human papilloma virus (HPV) types 6, 11, 16, and 18 administered. Also,  has a past surgical history that includes Nasal septum surgery., family history includes Bladder Cancer in her maternal grandmother; Heart disease in her maternal grandfather; Hypercholesterolemia in her maternal grandfather; Hypertension in her maternal grandfather.,  reports that she has never smoked. She has never used smokeless tobacco. She reports that she does not drink alcohol or use drugs. Current Meds  Medication Sig  . norethindrone-ethinyl estradiol (JUNEL FE 1/20) 1-20 MG-MCG tablet Take 1 tablet by mouth daily.  . [DISCONTINUED] buPROPion (WELLBUTRIN XL) 150 MG 24 hr tablet TAKE 1 TABLET BY MOUTH EVERY DAY  . Also, has No Known Allergies..  Review of Systems  All other systems reviewed and are negative.   Observations/Objective: No exam today, due to telephone eVisit due to [facility] virus restriction on  elective visits and procedures.  Prior visits reviewed along with ultrasounds/labs as indicated.  Assessment and Plan: 1. Depression with anxiety Prefers to discontinue Wellbutrin as feels it has not helped as much as coping skills and modifying external stressors in her schooling and life Rec stopping Wellbutrin and monitoring for any s/sx worsening depression or anxiety.  Follow Up Instructions: As needed Annual 05/2019   I discussed the assessment and treatment plan with the patient. The patient was provided an opportunity to ask questions and all were answered. The patient agreed with the plan and demonstrated an understanding of the instructions.   The patient was advised to [place] back or seek an in-person evaluation if the symptoms worsen or if the condition fails to improve as anticipated.  I provided 7 minutes of non-face-to-face time during this encounter.   Letitia Libra, MD

## 2019-04-26 ENCOUNTER — Telehealth: Payer: Self-pay | Admitting: Obstetrics & Gynecology

## 2019-04-26 NOTE — Telephone Encounter (Signed)
Sch Annual 05/2019 Refill done

## 2019-04-26 NOTE — Telephone Encounter (Signed)
Called and left voice mail for patient to [place] back to be schedule °

## 2019-04-28 ENCOUNTER — Other Ambulatory Visit: Payer: Self-pay

## 2019-04-28 ENCOUNTER — Telehealth: Payer: Self-pay | Admitting: Certified Nurse Midwife

## 2019-04-28 MED ORDER — NORETHIN ACE-ETH ESTRAD-FE 1-20 MG-MCG PO TABS: 1.0000 | ORAL_TABLET | Freq: Every day | ORAL | 0 refills | Status: DC

## 2019-04-28 NOTE — Telephone Encounter (Signed)
Rx refilled left voice message to advise

## 2019-04-28 NOTE — Telephone Encounter (Signed)
Patient will need refill on bc before her annual scheduled with [facility]G 2/22.  [facility].

## 2019-06-05 NOTE — Progress Notes (Signed)
Gynecology Annual Exam  PCP: Patient, No Pcp Per  Chief Complaint:  Chief Complaint  Patient presents with  . Gynecologic Exam    on-going odor specifically when wearing leggings for porlonged time; rec vitamins for women's health    History of Present Illness:   Wendy Garrett presents today for her annual exam. She is a 24 year old Caucasian/White female, G 0 P 0 0 0 0, whose LMP was 05/31/2019 . Her menses are regular, come every 28 days and last 3-4 days, with a moderate flow. The patient's past medical history is notable for a history of Cardiac Murmur and Dysmenorrhea.  Since her last annual GYN exam dated 05/14/2018 , she was treated for situational stress/ anxiety for a short time but was able to wean off the medication by using behavioral modifications. Currently complains of a vaginal odor, specifically when wearing leggings for a prolonged time. No vulvar itching or irritation. She is currently sexually active.  She denies any history of STDs. She uses OCPs for contraception Last Pap smear was 05/14/2018 and was negative. Mammogram is not applicable. There is no family history of breast cancer. There is no family history of ovarian cancer. The patient does not do monthly self breast exams.  The patient does not smoke.  The patient does not drink alcohol.  The patient does not use illegal drugs.  The patient exercises occasionally. She has a BMI of 25.39kg/m2  The patient does get adequate calcium in her diet.  She has completed the Gardasil vaccine series.      Review of Systems: Review of Systems  Constitutional: Negative for chills, fever and weight loss.       +weight gain  HENT: Negative for congestion, sinus pain and sore throat.   Eyes: Negative for blurred vision and pain.  Respiratory: Negative for hemoptysis, shortness of breath and wheezing.   Cardiovascular: Negative for chest pain, palpitations and leg swelling.  Gastrointestinal: Positive for diarrhea  (about a month ago, resolved with dietary changes). Negative for abdominal pain, blood in stool, heartburn, nausea and vomiting.  Genitourinary: Negative for dysuria, frequency, hematuria and urgency.  Musculoskeletal: Negative for back pain, joint pain and myalgias.  Skin: Negative for itching and rash.  Neurological: Negative for dizziness, tingling and headaches.  Endo/Heme/Allergies: Negative for environmental allergies and polydipsia. Does not bruise/bleed easily.       Negative for hirsutism   Psychiatric/Behavioral: Negative for depression. The patient is not nervous/anxious and does not have insomnia.     Past Medical History:  Past Medical History:  Diagnosis Date  . Acne   . Cardiac murmur   . Dysmenorrhea   . Eczema   . Vaccine for human papilloma virus (HPV) types 6, 11, 16, and 18 administered    series completed    Past Surgical History:  Past Surgical History:  Procedure Laterality Date  . NASAL SEPTUM SURGERY       Family History:  Family History  Problem Relation Age of Onset  . Bladder Cancer Maternal Grandmother   . Heart disease Maternal Grandfather   . Hypercholesterolemia Maternal Grandfather   . Hypertension Maternal Grandfather   . Cancer Paternal Grandmother 65       leg and mass in her kidney    Social History:  Social History   Socioeconomic History  . Marital status: Single    Spouse name: Not on file  . Number of children: Not on file  . Years of  education: Not on file  . Highest education level: Not on file  Occupational History  . Occupation: CNA  Tobacco Use  . Smoking status: Never Smoker  . Smokeless tobacco: Never Used  Substance and Sexual Activity  . Alcohol use: No  . Drug use: No  . Sexual activity: Yes    Birth control/protection: Pill  Other Topics Concern  . Not on file  Social History Narrative  . Not on file   Social Determinants of Health   Financial Resource Strain:   . Difficulty of Paying Living  Expenses: Not on file  Food Insecurity:   . Worried About Charity fundraiser in the Last Year: Not on file  . Ran Out of Food in the Last Year: Not on file  Transportation Needs:   . Lack of Transportation (Medical): Not on file  . Lack of Transportation (Non-Medical): Not on file  Physical Activity:   . Days of Exercise per Week: Not on file  . Minutes of Exercise per Session: Not on file  Stress:   . Feeling of Stress : Not on file  Social Connections:   . Frequency of Communication with Friends and Family: Not on file  . Frequency of Social Gatherings with Friends and Family: Not on file  . Attends Religious Services: Not on file  . Active Member of Clubs or Organizations: Not on file  . Attends Archivist Meetings: Not on file  . Marital Status: Not on file  Intimate Partner Violence:   . Fear of Current or Ex-Partner: Not on file  . Emotionally Abused: Not on file  . Physically Abused: Not on file  . Sexually Abused: Not on file    Allergies:  No Known Allergies  Medications:  Current Outpatient Medications:  .  norethindrone-ethinyl estradiol (JUNEL FE 1/20) 1-20 MG-MCG tablet, Take 1 tablet by mouth daily., Disp: 84 tablet, Rfl: 3 Physical Exam Vitals: BP 120/80   Pulse 90   Ht 5\' 4"  (1.626 m)   Wt 148 lb (67.1 kg)   LMP 05/31/2019 (Exact Date)   BMI 25.40 kg/m   General: WF in NAD, well nourished, well developed HEENT: normocephalic, anicteric Thyroid: no enlargement, no palpable nodules Pulmonary: No increased work of breathing, CTAB Cardiovascular: RRR without murmur Breast: Breast symmetrical, no tenderness, no palpable nodules or masses, no skin or nipple retraction present, no nipple discharge.  No axillary, infraclavicular or supraclavicular lymphadenopathy. Abdomen: soft, non-tender, non-distended.  Umbilicus without lesions.  No hepatomegaly or masses palpable. No evidence of hernia  Genitourinary:  External: Normal external female  genitalia.  Normal urethral meatus, normal Bartholin's and Skene's glands.    Vagina: no lesions or tenderness    Cervix: no lesions, no bleeding, IUD strings present  Uterus: AF, NSSC, mobile, NT  Adnexa: ovaries non-enlarged, no adnexal masses  Rectal: deferred  Lymphatic: no evidence of inguinal lymphadenopathy Extremities: no edema, erythema, or tenderness Neurologic: Grossly intact Psychiatric: mood appropriate, affect full  Wet prep-negative for Trich, clue cells, and hyphae   Assessment: 24 y.o. well woman exam   Plan:   1)  Recommend doing self breast exams monthly  2) STI screening was offered and accepted. Aptima done  3) Pap not done. Desires Pap smears every 3 years  4) Contraception: Refill OCPs x 1 year  5) RTO in 1 year for annual and prn  Dalia Heading, CNM

## 2019-06-06 ENCOUNTER — Encounter: Payer: Self-pay | Admitting: Certified Nurse Midwife

## 2019-06-06 ENCOUNTER — Ambulatory Visit (INDEPENDENT_AMBULATORY_CARE_PROVIDER_SITE_OTHER): Payer: BC Managed Care – PPO | Admitting: Certified Nurse Midwife

## 2019-06-06 ENCOUNTER — Other Ambulatory Visit: Payer: Self-pay

## 2019-06-06 VITALS — BP 120/80 | HR 90 | Ht 64.0 in | Wt 148.0 lb

## 2019-06-06 DIAGNOSIS — Z01411 Encounter for gynecological examination (general) (routine) with abnormal findings: Secondary | ICD-10-CM | POA: Diagnosis not present

## 2019-06-06 DIAGNOSIS — Z113 Encounter for screening for infections with a predominantly sexual mode of transmission: Secondary | ICD-10-CM | POA: Diagnosis not present

## 2019-06-06 DIAGNOSIS — N898 Other specified noninflammatory disorders of vagina: Secondary | ICD-10-CM

## 2019-06-06 DIAGNOSIS — Z01419 Encounter for gynecological examination (general) (routine) without abnormal findings: Secondary | ICD-10-CM

## 2019-06-06 DIAGNOSIS — Z3041 Encounter for surveillance of contraceptive pills: Secondary | ICD-10-CM | POA: Diagnosis not present

## 2019-06-06 MED ORDER — NORETHIN ACE-ETH ESTRAD-FE 1-20 MG-MCG PO TABS: 1.0000 | ORAL_TABLET | Freq: Every day | ORAL | 3 refills | Status: DC

## 2019-06-07 LAB — CHLAMYDIA/GONOCOCCUS/TRICHOMONAS, NAA
Chlamydia by NAA: NEGATIVE
Gonococcus by NAA: NEGATIVE
Trich vag by NAA: NEGATIVE

## 2019-06-09 ENCOUNTER — Encounter: Payer: Self-pay | Admitting: Certified Nurse Midwife

## 2019-06-09 LAB — POCT WET PREP (WET MOUNT): Trichomonas Wet Prep HPF POC: ABSENT

## 2019-09-02 ENCOUNTER — Telehealth: Payer: Self-pay

## 2020-05-15 ENCOUNTER — Other Ambulatory Visit: Payer: Self-pay

## 2020-05-15 MED ORDER — NORETHIN ACE-ETH ESTRAD-FE 1-20 MG-MCG PO TABS: 1.0000 | ORAL_TABLET | Freq: Every day | ORAL | 0 refills | Status: DC

## 2020-06-08 ENCOUNTER — Ambulatory Visit (INDEPENDENT_AMBULATORY_CARE_PROVIDER_SITE_OTHER): Payer: BC Managed Care – PPO | Admitting: Obstetrics and Gynecology

## 2020-06-08 ENCOUNTER — Other Ambulatory Visit: Payer: Self-pay

## 2020-06-08 ENCOUNTER — Encounter: Payer: Self-pay | Admitting: Obstetrics and Gynecology

## 2020-06-08 VITALS — BP 122/70 | Ht 62.0 in | Wt 162.5 lb

## 2020-06-08 DIAGNOSIS — R875 Abnormal microbiological findings in specimens from female genital organs: Secondary | ICD-10-CM | POA: Diagnosis not present

## 2020-06-08 DIAGNOSIS — Z0000 Encounter for general adult medical examination without abnormal findings: Secondary | ICD-10-CM | POA: Diagnosis not present

## 2020-06-08 DIAGNOSIS — Z01419 Encounter for gynecological examination (general) (routine) without abnormal findings: Secondary | ICD-10-CM | POA: Diagnosis not present

## 2020-06-08 DIAGNOSIS — Z113 Encounter for screening for infections with a predominantly sexual mode of transmission: Secondary | ICD-10-CM

## 2020-06-08 DIAGNOSIS — Z1239 Encounter for other screening for malignant neoplasm of breast: Secondary | ICD-10-CM

## 2020-06-08 DIAGNOSIS — Z3041 Encounter for surveillance of contraceptive pills: Secondary | ICD-10-CM

## 2020-06-08 MED ORDER — NORETHIN ACE-ETH ESTRAD-FE 1-20 MG-MCG PO TABS: 1.0000 | ORAL_TABLET | Freq: Every day | ORAL | 3 refills | Status: AC

## 2020-06-08 NOTE — Patient Instructions (Signed)
Exercising to Stay Healthy To become healthy and stay healthy, it is recommended that you do moderate-intensity and vigorous-intensity exercise. You can tell that you are exercising at a moderate intensity if your heart starts beating faster and you start breathing faster but can still hold a conversation. You can tell that you are exercising at a vigorous intensity if you are breathing much harder and faster and cannot hold a conversation while exercising. Exercising regularly is important. It has many health benefits, such as:  Improving overall fitness, flexibility, and endurance.  Increasing bone density.  Helping with weight control.  Decreasing body fat.  Increasing muscle strength.  Reducing stress and tension.  Improving overall health. How often should I exercise? Choose an activity that you enjoy, and set realistic goals. Your health care provider can help you make an activity plan that works for you. Exercise regularly as told by your health care provider. This may include:  Doing strength training two times a week, such as: ? Lifting weights. ? Using resistance bands. ? Push-ups. ? Sit-ups. ? Yoga.  Doing a certain intensity of exercise for a given amount of time. Choose from these options: ? A total of 150 minutes of moderate-intensity exercise every week. ? A total of 75 minutes of vigorous-intensity exercise every week. ? A mix of moderate-intensity and vigorous-intensity exercise every week. Children, pregnant women, people who have not exercised regularly, people who are overweight, and older adults may need to talk with a health care provider about what activities are safe to do. If you have a medical condition, be sure to talk with your health care provider before you start a new exercise program. What are some exercise ideas? Moderate-intensity exercise ideas include:  Walking 1 mile (1.6 km) in about 15  minutes.  Biking.  Hiking.  Golfing.  Dancing.  Water aerobics. Vigorous-intensity exercise ideas include:  Walking 4.5 miles (7.2 km) or more in about 1 hour.  Jogging or running 5 miles (8 km) in about 1 hour.  Biking 10 miles (16.1 km) or more in about 1 hour.  Lap swimming.  Roller-skating or in-line skating.  Cross-country skiing.  Vigorous competitive sports, such as football, basketball, and soccer.  Jumping rope.  Aerobic dancing.   What are some everyday activities that can help me to get exercise?  Yard work, such as: ? Pushing a lawn mower. ? Raking and bagging leaves.  Washing your car.  Pushing a stroller.  Shoveling snow.  Gardening.  Washing windows or floors. How can I be more active in my day-to-day activities?  Use stairs instead of an elevator.  Take a walk during your lunch break.  If you drive, park your car farther away from your work or school.  If you take public transportation, get off one stop early and walk the rest of the way.  Stand up or walk around during all of your indoor phone calls.  Get up, stretch, and walk around every 30 minutes throughout the day.  Enjoy exercise with a friend. Support to continue exercising will help you keep a regular routine of activity. What guidelines can I follow while exercising?  Before you start a new exercise program, talk with your health care provider.  Do not exercise so much that you hurt yourself, feel dizzy, or get very short of breath.  Wear comfortable clothes and wear shoes with good support.  Drink plenty of water while you exercise to prevent dehydration or heat stroke.  Work out until   your breathing and your heartbeat get faster. Where to find more information  [place] Department of Health and Human Services: [employer]ed[employer].at  Centers for Disease Control and Prevention (CDC): http://www.wolf.info/ Summary  Exercising regularly is important. It will improve your overall fitness,  flexibility, and endurance.  Regular exercise also will improve your overall health. It can help you control your weight, reduce stress, and improve your bone density.  Do not exercise so much that you hurt yourself, feel dizzy, or get very short of breath.  Before you start a new exercise program, talk with your health care provider. This information is not intended to replace advice given to you by your health care provider. Make sure you discuss any questions you have with your health care provider. Document Revised: 03/13/2017 Document Reviewed: 02/19/2017 Elsevier Patient Education  2021 [place].   Budget-Friendly Healthy Eating There are many ways to save money at the grocery store and continue to eat healthy. You can be successful if you:  Plan meals according to your budget.  Make a grocery list and only purchase food according to your grocery list.  Prepare food yourself at home. What are tips for following this plan? Reading food labels  Compare food labels between brand name foods and the store brand. Often the nutritional value is the same, but the store brand is lower cost.  Look for products that do not have added sugar, fat, or salt (sodium). These often cost the same but are healthier for you. Products may be labeled as: ? Sugar-free. ? Nonfat. ? Low-fat. ? Sodium-free. ? Low-sodium.  Look for lean ground beef labeled as at least 92% lean and 8% fat. Shopping  Buy only the items on your grocery list and go only to the areas of the store that have the items on your list.  Use coupons only for foods and brands you normally buy. Avoid buying items you wouldn't normally buy simply because they are on sale.  Check online and in newspapers for weekly deals.  Buy healthy items from the bulk bins when available, such as herbs, spices, flour, pasta, nuts, and dried fruit.  Buy fruits and vegetables that are in season. Prices are usually lower on in-season  produce.  Look at the unit price on the price tag. Use it to compare different brands and sizes to find out which item is the best deal.  Choose healthy items that are often low-cost, such as carrots, potatoes, apples, bananas, and oranges. Dried or canned beans are a low-cost protein source.  Buy in bulk and freeze extra food. Items you can buy in bulk include meats, fish, poultry, frozen fruits, and frozen vegetables.  Avoid buying "ready-to-eat" foods, such as pre-cut fruits and vegetables and pre-made salads.  If possible, shop around to discover where you can find the best prices. Consider other retailers such as dollar stores, larger [employer], local fruit and vegetable stands, and farmers markets.  Do not shop when you are hungry. If you shop while hungry, it may be hard to stick to your list and budget.  Resist impulse buying. Use your grocery list as your official plan for the week.  Buy a variety of vegetables and fruits by purchasing fresh, frozen, and canned items.  Look at the top and bottom shelves for deals. Foods at eye level (eye level of an adult or child) are usually more expensive.  Be efficient with your time when shopping. The more time you spend at the store, the  more money you are likely to spend.  To save money when choosing more expensive foods like meats and dairy: ? Choose cheaper cuts of meat, such as bone-in chicken thighs and drumsticks instead of skinless and boneless chicken. When you are ready to prepare the chicken, you can remove the skin yourself to make it healthier. ? Choose lean meats like chicken or [place] instead of beef. ? Choose canned seafood, such as tuna, salmon, or sardines. ? Buy eggs as a low-cost source of protein. ? Buy dried beans and peas, such as lentils, split peas, or kidney beans instead of meats. Dried beans and peas are a good alternative source of protein. ? Buy the larger tubs of yogurt instead of individual-sized  containers.  Choose water instead of sodas and other sweetened beverages.  Avoid buying chips, cookies, and other "junk food." These items are usually expensive and not healthy.   Cooking  Make extra food and freeze the extras in meal-sized containers or in individual portions for fast meals and snacks.  Pre-cook on days when you have extra time to prepare meals in advance. You can keep these meals in the fridge or freezer and reheat for a quick meal.  When you come home from the grocery store, wash, peel, and cut fruits and vegetables so they are ready to use and eat. This will help reduce food waste. Meal planning  Do not eat out or get fast food. Prepare food at home.  Make a grocery list and make sure to bring it with you to the store. If you have a smart phone, you could use your phone to create your shopping list.  Plan meals and snacks according to a grocery list and budget you create.  Use leftovers in your meal plan for the week.  Look for recipes where you can cook once and make enough food for two meals.  Prepare budget-friendly types of meals like stews, casseroles, and stir-fry dishes.  Try some meatless meals or try "no cook" meals like salads.  Make sure that half your plate is filled with fruits or vegetables. Choose from fresh, frozen, or canned fruits and vegetables. If eating canned, remember to rinse them before eating. This will remove any excess salt added for packaging. Summary  Eating healthy on a budget is possible if you plan your meals according to your budget, purchase according to your budget and grocery list, and prepare food yourself.  Tips for buying more food on a limited budget include buying generic brands, using coupons only for foods you normally buy, and buying healthy items from the bulk bins when available.  Tips for buying cheaper food to replace expensive food include choosing cheaper, lean cuts of meat, and buying dried beans and  peas. This information is not intended to replace advice given to you by your health care provider. Make sure you discuss any questions you have with your health care provider. Document Revised: 01/12/2020 Document Reviewed: 01/12/2020 Elsevier Patient Education  [place] protect organs, store calcium, anchor muscles, and support the whole body. Keeping your bones strong is important, especially as you get older. You can take actions to help keep your bones strong and healthy. Why is keeping my bones healthy important? Keeping your bones healthy is important because your body constantly replaces bone cells. Cells get old, and new cells take their place. As we age, we lose bone cells because the body may not be able to make  enough new cells to replace the old cells. The amount of bone cells and bone tissue you have is referred to as bone mass. The higher your bone mass, the stronger your bones. The aging process leads to an overall loss of bone mass in the body, which can increase the likelihood of:  Joint pain and stiffness.  Broken bones.  A condition in which the bones become weak and brittle (osteoporosis). A large decline in bone mass occurs in older adults. In women, it occurs about the time of menopause.   What actions can I take to keep my bones healthy? Good health habits are important for maintaining healthy bones. This includes eating nutritious foods and exercising regularly. To have healthy bones, you need to get enough of the right minerals and vitamins. Most nutrition experts recommend getting these nutrients from the foods that you eat. In some cases, taking supplements may also be recommended. Doing certain types of exercise is also important for bone health. What are the nutritional recommendations for healthy bones? Eating a well-balanced diet with plenty of calcium and vitamin D will help to protect your bones. Nutritional recommendations vary  from person to person. Ask your health care provider what is healthy for you. Here are some general guidelines. Get enough calcium Calcium is the most important (essential) mineral for bone health. Most people can get enough calcium from their diet, but supplements may be recommended for people who are at risk for osteoporosis. Good sources of calcium include:  Dairy products, such as low-fat or nonfat milk, cheese, and yogurt.  Dark green leafy vegetables, such as bok choy and broccoli.  Calcium-fortified foods, such as orange juice, cereal, bread, soy beverages, and tofu products.  Nuts, such as almonds. Follow these recommended amounts for daily calcium intake:  Children, age 60-3: 700 mg.  Children, age 70-8: 1,000 mg.  Children, age 29-13: 1,300 mg.  Teens, age 7-18: 1,300 mg.  Adults, age 39-50: 1,000 mg.  Adults, age 708-70: ? Men: 1,000 mg. ? Women: 1,200 mg.  Adults, age 64 or older: 1,200 mg.  Pregnant and breastfeeding females: ? Teens: 1,300 mg. ? Adults: 1,000 mg. Get enough vitamin D Vitamin D is the most essential vitamin for bone health. It helps the body absorb calcium. Sunlight stimulates the skin to make vitamin D, so be sure to get enough sunlight. If you live in a cold climate or you do not get outside often, your health care provider may recommend that you take vitamin D supplements. Good sources of vitamin D in your diet include:  Egg yolks.  Saltwater fish.  Milk and cereal fortified with vitamin D. Follow these recommended amounts for daily vitamin D intake:  Children and teens, age 60-18: 600 international units.  Adults, age 51 or younger: 400-800 international units.  Adults, age 709 or older: 800-1,000 international units. Get other important nutrients Other nutrients that are important for bone health include:  Phosphorus. This mineral is found in meat, poultry, dairy foods, nuts, and legumes. The recommended daily intake for adult men and  adult women is 700 mg.  Magnesium. This mineral is found in seeds, nuts, dark green vegetables, and legumes. The recommended daily intake for adult men is 400-420 mg. For adult women, it is 310-320 mg.  Vitamin K. This vitamin is found in green leafy vegetables. The recommended daily intake is 120 mg for adult men and 90 mg for adult women.   What type of physical activity is best for building  and maintaining healthy bones? Weight-bearing and strength-building activities are important for building and maintaining healthy bones. Weight-bearing activities cause muscles and bones to work against gravity. Strength-building activities increase the strength of the muscles that support bones. Weight-bearing and muscle-building activities include:  Walking and hiking.  Jogging and running.  Dancing.  Gym exercises.  Lifting weights.  Tennis and racquetball.  Climbing stairs.  Aerobics. Adults should get at least 30 minutes of moderate physical activity on most days. Children should get at least 60 minutes of moderate physical activity on most days. Ask your health care provider what type of exercise is best for you.   How can I find out if my bone mass is low? Bone mass can be measured with an X-ray test called a bone mineral density (BMD) test. This test is recommended for all women who are age 80 or older. It may also be recommended for:  Men who are age 26 or older.  People who are at risk for osteoporosis because of: ? Having bones that break easily. ? Having a long-term disease that weakens bones, such as kidney disease or rheumatoid arthritis. ? Having menopause earlier than normal. ? Taking medicine that weakens bones, such as steroids, thyroid hormones, or hormone treatment for breast cancer or prostate cancer. ? Smoking. ? Drinking three or more alcoholic drinks a day. If you find that you have a low bone mass, you may be able to prevent osteoporosis or further bone loss by  changing your diet and lifestyle. Where can I find more information? For more information, check out the following websites:  National Osteoporosis Foundation: https://carlson-fletcher.info/  [employer] of [employer]: www.bones.http://www.myers.net/  International Osteoporosis Foundation: [employer], operational.iofbonehealth.org Summary  The aging process leads to an overall loss of bone mass in the body, which can increase the likelihood of broken bones and osteoporosis.  Eating a well-balanced diet with plenty of calcium and vitamin D will help to protect your bones.  Weight-bearing and strength-building activities are also important for building and maintaining strong bones.  Bone mass can be measured with an X-ray test called a bone mineral density (BMD) test. This information is not intended to replace advice given to you by your health care provider. Make sure you discuss any questions you have with your health care provider. Document Revised: 04/27/2017 Document Reviewed: 04/27/2017 Elsevier Patient Education  2021 Elsevier Inc.   Managing Anxiety, Adult After being diagnosed with an anxiety disorder, you may be relieved to know why you have felt or behaved a certain way. You may also feel overwhelmed about the treatment ahead and what it will mean for your life. With care and support, you can manage this condition and recover from it. How to manage lifestyle changes Managing stress and anxiety Stress is your body's reaction to life changes and events, both good and bad. Most stress will last just a few hours, but stress can be ongoing and can lead to more than just stress. Although stress can play a major role in anxiety, it is not the same as anxiety. Stress is usually caused by something external, such as a deadline, test, or competition. Stress normally passes after the triggering event has ended.  Anxiety is caused by something internal, such as imagining a terrible outcome or worrying that something will go  wrong that will devastate you. Anxiety often does not go away even after the triggering event is over, and it can become long-term (chronic) worry. It is important to understand the differences between  stress and anxiety and to manage your stress effectively so that it does not lead to an anxious response. Talk with your health care provider or a counselor to learn more about reducing anxiety and stress. He or she may suggest tension reduction techniques, such as:  Music therapy. This can include creating or listening to music that you enjoy and that inspires you.  Mindfulness-based meditation. This involves being aware of your normal breaths while not trying to control your breathing. It can be done while sitting or walking.  Centering prayer. This involves focusing on a word, phrase, or sacred image that means something to you and brings you peace.  Deep breathing. To do this, expand your stomach and inhale slowly through your nose. Hold your breath for 3-5 seconds. Then exhale slowly, letting your stomach muscles relax.  Self-talk. This involves identifying thought patterns that lead to anxiety reactions and changing those patterns.  Muscle relaxation. This involves tensing muscles and then relaxing them. Choose a tension reduction technique that suits your lifestyle and personality. These techniques take time and practice. Set aside 5-15 minutes a day to do them. Therapists can offer counseling and training in these techniques. The training to help with anxiety may be covered by some insurance plans. Other things you can do to manage stress and anxiety include:  Keeping a stress/anxiety diary. This can help you learn what triggers your reaction and then learn ways to manage your response.  Thinking about how you react to certain situations. You may not be able to control everything, but you can control your response.  Making time for activities that help you relax and not feeling guilty about  spending your time in this way.  Visual imagery and yoga can help you stay calm and relax.   Medicines Medicines can help ease symptoms. Medicines for anxiety include:  Anti-anxiety drugs.  Antidepressants. Medicines are often used as a primary treatment for anxiety disorder. Medicines will be prescribed by a health care provider. When used together, medicines, psychotherapy, and tension reduction techniques may be the most effective treatment. Relationships Relationships can play a big part in helping you recover. Try to spend more time connecting with trusted friends and family members. Consider going to couples counseling, taking family education classes, or going to family therapy. Therapy can help you and others better understand your condition. How to recognize changes in your anxiety Everyone responds differently to treatment for anxiety. Recovery from anxiety happens when symptoms decrease and stop interfering with your daily activities at home or work. This may mean that you will start to:  Have better concentration and focus. Worry will interfere less in your daily thinking.  Sleep better.  Be less irritable.  Have more energy.  Have improved memory. It is important to recognize when your condition is getting worse. Contact your health care provider if your symptoms interfere with home or work and you feel like your condition is not improving. Follow these instructions at home: Activity  Exercise. Most adults should do the following: ? Exercise for at least 150 minutes each week. The exercise should increase your heart rate and make you sweat (moderate-intensity exercise). ? Strengthening exercises at least twice a week.  Get the right amount and quality of sleep. Most adults need 7-9 hours of sleep each night. Lifestyle  Eat a healthy diet that includes plenty of vegetables, fruits, whole grains, low-fat dairy products, and lean protein. Do not eat a lot of foods that  are high in  solid fats, added sugars, or salt.  Make choices that simplify your life.  Do not use any products that contain nicotine or tobacco, such as cigarettes, e-cigarettes, and chewing tobacco. If you need help quitting, ask your health care provider.  Avoid caffeine, alcohol, and certain over-the-counter cold medicines. These may make you feel worse. Ask your pharmacist which medicines to avoid.   General instructions  Take over-the-counter and prescription medicines only as told by your health care provider.  Keep all follow-up visits as told by your health care provider. This is important. Where to find support You can get help and support from these sources:  Self-help groups.  Online and community organizations.  A trusted spiritual leader.  Couples counseling.  Family education classes.  Family therapy. Where to find more information You may find that joining a support group helps you deal with your anxiety. The following sources can help you locate counselors or support groups near you:  Mental Health America: www.mentalhealthamerica.net  Anxiety and Depression Association of America (ADAA): www.adaa.org  National Alliance on Mental Illness (NAMI): www.nami.org Contact a health care provider if you:  Have a hard time staying focused or finishing daily tasks.  Spend many hours a day feeling worried about everyday life.  Become exhausted by worry.  Start to have headaches, feel tense, or have nausea.  Urinate more than normal.  Have diarrhea. Get help right away if you have:  A racing heart and shortness of breath.  Thoughts of hurting yourself or others. If you ever feel like you may hurt yourself or others, or have thoughts about taking your own life, get help right away. You can go to your nearest emergency department or [place]:  Your local emergency services (911 in the [place]  A suicide crisis helpline, such as the National Suicide Prevention  Lifeline at 1-800-273-8255. This is open 24 hours a day. Summary  Taking steps to learn and use tension reduction techniques can help calm you and help prevent triggering an anxiety reaction.  When used together, medicines, psychotherapy, and tension reduction techniques may be the most effective treatment.  Family, friends, and partners can play a big part in helping you recover from an anxiety disorder. This information is not intended to replace advice given to you by your health care provider. Make sure you discuss any questions you have with your health care provider. Document Revised: 08/31/2018 Document Reviewed: 08/31/2018 Elsevier Patient Education  2021 Elsevier Inc.   

## 2020-06-08 NOTE — Progress Notes (Signed)
Gynecology Annual Exam  PCP: Patient, No Pcp Per  Chief Complaint:  Chief Complaint  Patient presents with  . Gynecologic Exam    History of Present Illness: Patient is a 25 y.o. G0P0000 presents for annual exam. The patient has no complaints today.   LMP: Patient's last menstrual period was 05/18/2020. Average Interval: monthly Duration of flow: 4 days Heavy Menses: no Intermenstrual Bleeding: no Dysmenorrhea: no  The patient does perform self breast exams.  There is no notable family history of breast or ovarian cancer in her family.  The patient reports her exercise generally consists of walking 2 times a week .  The patient denies current symptoms of depression.   PHQ-9: 0 GAD-7: 6 - reports anxiety regarding transitioning after college. Is obtaining degree in pelvic health   Review of Systems: Review of Systems  Constitutional: Negative for chills, fever, malaise/fatigue and weight loss.  HENT: Negative for congestion, hearing loss and sinus pain.   Eyes: Negative for blurred vision and double vision.  Respiratory: Negative for cough, sputum production, shortness of breath and wheezing.   Cardiovascular: Negative for chest pain, palpitations, orthopnea and leg swelling.  Gastrointestinal: Negative for abdominal pain, constipation, diarrhea, nausea and vomiting.  Genitourinary: Negative for dysuria, flank pain, frequency, hematuria and urgency.  Musculoskeletal: Negative for back pain, falls and joint pain.  Skin: Negative for itching and rash.  Neurological: Negative for dizziness and headaches.  Psychiatric/Behavioral: Negative for depression, substance abuse and suicidal ideas. The patient is not nervous/anxious.     Past Medical History:  Past Medical History:  Diagnosis Date  . Acne   . Cardiac murmur   . Dysmenorrhea   . Eczema   . Vaccine for human papilloma virus (HPV) types 6, 11, 16, and 18 administered    series completed    Past Surgical  History:  Past Surgical History:  Procedure Laterality Date  . NASAL SEPTUM SURGERY      Gynecologic History:  Patient's last menstrual period was 05/18/2020. Menarche: 11  History of fibroids, polyps, or ovarian cysts? : no  History of PCOS? no Hstory of Endometriosis? no History of abnormal pap smears? no Have you had any sexually transmitted infections in the past? no  She has HPV vaccination in the past.   Last Pap: Results were: 2021 NIL    She identifies as a female. She is sexually active.   She denies dyspareunia. She denies postcoital bleeding.  She currently uses OCP (estrogen/progesterone) for contraception.    Obstetric History: G0P0000  Family History:  Family History  Problem Relation Age of Onset  . Bladder Cancer Maternal Grandmother   . Heart disease Maternal Grandfather   . Hypercholesterolemia Maternal Grandfather   . Hypertension Maternal Grandfather   . Cancer Paternal Grandmother 65       leg and mass in her kidney    Social History:  Social History   Socioeconomic History  . Marital status: Single    Spouse name: Not on file  . Number of children: Not on file  . Years of education: Not on file  . Highest education level: Not on file  Occupational History  . Occupation: CNA  Tobacco Use  . Smoking status: Never Smoker  . Smokeless tobacco: Never Used  Vaping Use  . Vaping Use: Never used  Substance and Sexual Activity  . Alcohol use: No  . Drug use: No  . Sexual activity: Yes    Birth control/protection: Pill  Other Topics  Concern  . Not on file  Social History Narrative  . Not on file   Social Determinants of Health   Financial Resource Strain: Not on file  Food Insecurity: Not on file  Transportation Needs: Not on file  Physical Activity: Not on file  Stress: Not on file  Social Connections: Not on file  Intimate Partner Violence: Not on file    Allergies:  No Known Allergies  Medications: Prior to Admission  medications   Medication Sig Start Date End Date Taking? Authorizing Provider  norethindrone-ethinyl estradiol (JUNEL FE 1/20) 1-20 MG-MCG tablet Take 1 tablet by mouth daily. 06/08/20   Natale Milch, MD    Physical Exam Vitals: Blood pressure 122/70, height 5\' 2"  (1.575 m), weight 162 lb 8 oz (73.7 kg), last menstrual period 05/18/2020.  Physical Exam Constitutional:      Appearance: She is well-developed.  Genitourinary:     Genitourinary Comments: External: Normal appearing vulva. No lesions noted.  Bimanual examination: Uterus midline, non-tender, normal in size, shape and contour.  No CMT. No adnexal masses. No adnexal tenderness. Pelvis not fixed.  Normal breast exam bilaterally  HENT:     Head: Normocephalic and atraumatic.  Neck:     Thyroid: No thyromegaly.  Cardiovascular:     Rate and Rhythm: Normal rate and regular rhythm.     Heart sounds: Normal heart sounds.  Pulmonary:     Effort: Pulmonary effort is normal.     Breath sounds: Normal breath sounds.  Abdominal:     General: Bowel sounds are normal. There is no distension.     Palpations: Abdomen is soft. There is no mass.  Musculoskeletal:     Cervical back: Neck supple.  Neurological:     Mental Status: She is alert and oriented to person, place, and time.  Skin:    General: Skin is warm and dry.  Psychiatric:        Behavior: Behavior normal.        Thought Content: Thought content normal.        Judgment: Judgment normal.  Vitals reviewed.      Female chaperone present for pelvic and breast  portions of the physical exam  Assessment: 25 y.o. G0P0000 routine annual exam  Plan: Problem List Items Addressed This Visit   None   Visit Diagnoses    Screening for STD (sexually transmitted disease)    -  Primary   Relevant Orders   NuSwab Vaginitis Plus (VG+)   Abnormal microbiological finding in specimen from female genital organ       Relevant Orders   NuSwab Vaginitis Plus (VG+)    Encounter for annual routine gynecological examination       Health maintenance examination       Encounter for screening breast examination       Encounter for gynecological examination without abnormal finding       Encounter for birth control pills maintenance       Relevant Medications   norethindrone-ethinyl estradiol (JUNEL FE 1/20) 1-20 MG-MCG tablet      1) STI screening was offered and accepted.  2) ASCCP guidelines and rational discussed.  Patient opts for every 3 years screening interval  3) Contraception - desires to continue her OCP. Refill sent  4) Routine healthcare maintenance including cholesterol, diabetes screening discussed , none needed today.   30 MD, [facility], [facility] 06/08/2020 9:15 AM

## 2020-06-11 ENCOUNTER — Other Ambulatory Visit: Payer: Self-pay | Admitting: Obstetrics and Gynecology

## 2020-06-11 DIAGNOSIS — B3731 Acute candidiasis of vulva and vagina: Secondary | ICD-10-CM

## 2020-06-11 DIAGNOSIS — B373 Candidiasis of vulva and vagina: Secondary | ICD-10-CM

## 2020-06-11 LAB — NUSWAB VAGINITIS PLUS (VG+)
Candida albicans, NAA: POSITIVE — AB
Candida glabrata, NAA: NEGATIVE
Chlamydia trachomatis, NAA: NEGATIVE
Neisseria gonorrhoeae, NAA: NEGATIVE
Trich vag by NAA: NEGATIVE

## 2020-06-11 MED ORDER — FLUCONAZOLE 150 MG PO TABS: 150.0000 mg | ORAL_TABLET | ORAL | 0 refills | Status: AC

## 2020-09-26 ENCOUNTER — Telehealth: Payer: Self-pay

## 2020-09-26 NOTE — Telephone Encounter (Signed)
Pt calling; thinks she has a UTI - left lower abd pain is very uncomfortable and has frequency; is hoping to get a refill of rx she got at her annual without coming in.  [phone]

## 2020-09-27 NOTE — Telephone Encounter (Signed)
Please have the patient come for a nurse visit to drop of urine sample- if UA is suggestive of an infection we can send it for culture and prescribe an antibiotic

## 2020-09-27 NOTE — Telephone Encounter (Signed)
[place] just wanted a rx sent in, she states she will find a OB in her state

## 2020-10-20 ENCOUNTER — Encounter (HOSPITAL_COMMUNITY): Payer: Self-pay

## 2020-10-20 ENCOUNTER — Emergency Department (HOSPITAL_COMMUNITY): Payer: BC Managed Care – PPO

## 2020-10-20 ENCOUNTER — Emergency Department (HOSPITAL_COMMUNITY)
Admission: EM | Admit: 2020-10-20 | Discharge: 2020-10-21 | Disposition: A | Discharge status: Home / Self Care | Payer: BC Managed Care – PPO | Attending: Emergency Medicine | Admitting: Emergency Medicine

## 2020-10-20 ENCOUNTER — Other Ambulatory Visit: Payer: Self-pay

## 2020-10-20 DIAGNOSIS — R112 Nausea with vomiting, unspecified: Secondary | ICD-10-CM | POA: Diagnosis not present

## 2020-10-20 DIAGNOSIS — N9489 Other specified conditions associated with female genital organs and menstrual cycle: Secondary | ICD-10-CM | POA: Insufficient documentation

## 2020-10-20 DIAGNOSIS — N200 Calculus of kidney: Secondary | ICD-10-CM | POA: Diagnosis not present

## 2020-10-20 DIAGNOSIS — N8301 Follicular cyst of right ovary: Secondary | ICD-10-CM | POA: Diagnosis not present

## 2020-10-20 DIAGNOSIS — R1031 Right lower quadrant pain: Secondary | ICD-10-CM

## 2020-10-20 DIAGNOSIS — R109 Unspecified abdominal pain: Secondary | ICD-10-CM | POA: Diagnosis not present

## 2020-10-20 LAB — CBC
HCT: 42.8 % (ref 36.0–46.0)
Hemoglobin: 14.1 g/dL (ref 12.0–15.0)
MCH: 27.5 pg (ref 26.0–34.0)
MCHC: 32.9 g/dL (ref 30.0–36.0)
MCV: 83.6 fL (ref 80.0–100.0)
Platelets: 245 10*3/uL (ref 150–400)
RBC: 5.12 MIL/uL — ABNORMAL HIGH (ref 3.87–5.11)
RDW: 12.7 % (ref 11.5–15.5)
WBC: 11.2 10*3/uL — ABNORMAL HIGH (ref 4.0–10.5)
nRBC: 0 % (ref 0.0–0.2)

## 2020-10-20 LAB — COMPREHENSIVE METABOLIC PANEL
ALT: 15 U/L (ref 0–44)
AST: 16 U/L (ref 15–41)
Albumin: 4 g/dL (ref 3.5–5.0)
Alkaline Phosphatase: 51 U/L (ref 38–126)
Anion gap: 8 (ref 5–15)
BUN: 12 mg/dL (ref 6–20)
CO2: 25 mmol/L (ref 22–32)
Calcium: 9.8 mg/dL (ref 8.9–10.3)
Chloride: 106 mmol/L (ref 98–111)
Creatinine, Ser: 1.09 mg/dL — ABNORMAL HIGH (ref 0.44–1.00)
GFR, Estimated: >60 mL/min (ref >60)
Glucose, Bld: 99 mg/dL (ref 70–99)
Potassium: 3.8 mmol/L (ref 3.5–5.1)
Sodium: 139 mmol/L (ref 135–145)
Total Bilirubin: 0.5 mg/dL (ref 0.3–1.2)
Total Protein: 7.6 g/dL (ref 6.5–8.1)

## 2020-10-20 LAB — URINALYSIS, ROUTINE W REFLEX MICROSCOPIC
Bilirubin Urine: NEGATIVE
Glucose, UA: NEGATIVE mg/dL
Ketones, ur: NEGATIVE mg/dL
Leukocytes,Ua: NEGATIVE
Nitrite: NEGATIVE
Protein, ur: NEGATIVE mg/dL
Specific Gravity, Urine: 1.005 (ref 1.005–1.030)
pH: 7 (ref 5.0–8.0)

## 2020-10-20 LAB — I-STAT BETA HCG BLOOD, ED (MC, WL, AP ONLY): I-stat hCG, quantitative: <5 m[IU]/mL (ref <5)

## 2020-10-20 LAB — LIPASE, BLOOD: Lipase: 35 U/L (ref 11–51)

## 2020-10-20 MED ORDER — SODIUM CHLORIDE 0.9 % IV BOLUS
1000.0000 mL | Freq: Once | INTRAVENOUS | Status: AC
  Administered 2020-10-20: 1000 mL via INTRAVENOUS

## 2020-10-20 MED ORDER — ONDANSETRON HCL 4 MG/2ML IJ SOLN
4.0000 mg | Freq: Once | INTRAMUSCULAR | Status: AC
  Administered 2020-10-20: 4 mg via INTRAVENOUS
  Filled 2020-10-20: qty 2

## 2020-10-20 MED ORDER — MORPHINE SULFATE (PF) 4 MG/ML IV SOLN
4.0000 mg | Freq: Once | INTRAVENOUS | Status: AC
  Administered 2020-10-20: 4 mg via INTRAVENOUS
  Filled 2020-10-20: qty 1

## 2020-10-20 NOTE — ED Triage Notes (Addendum)
Pt reports severe RLQ abdominal pain that began in her back about an hour ago. Pt also endorses nausea. Denies V/D.

## 2020-10-20 NOTE — ED Provider Notes (Signed)
[facility]-EMERGENCY DEPT Provider Note   CSN: [id number] Arrival date & time: 10/20/20  2200     History Chief Complaint  Patient presents with   Abdominal Pain    Wendy Garrett is a 25 y.o. female.  25 year old female presents to the emergency department for sudden onset of right lower quadrant abdominal pain which began at 2130.  Pain has been constant and waxing and waning in severity.  Pain originally was radiating towards her back, but now it is constant and nonradiating in her right lower abdomen.  She has had nausea and persistent vomiting secondary to worsening pain.  No diarrhea or urinary symptoms, fever, vaginal complaints.  No history of abdominal surgeries.  The history is provided by the patient. No language interpreter was used.  Abdominal Pain     Past Medical History:  Diagnosis Date   Acne    Cardiac murmur    Dysmenorrhea    Eczema    Vaccine for human papilloma virus (HPV) types 6, 11, 16, and 18 administered    series completed    Patient Active Problem List   Diagnosis Date Noted   Seasonal allergic rhinitis due to pollen 09/23/2016    Past Surgical History:  Procedure Laterality Date   NASAL SEPTUM SURGERY       OB History     Gravida  0   Para  0   Term  0   Preterm  0   AB  0   Living  0      SAB  0   IAB  0   Ectopic  0   Multiple  0   Live Births  0           Family History  Problem Relation Age of Onset   Bladder Cancer Maternal Grandmother    Heart disease Maternal Grandfather    Hypercholesterolemia Maternal Grandfather    Hypertension Maternal Grandfather    Cancer Paternal Grandmother 65       leg and mass in her kidney    Social History   Tobacco Use   Smoking status: Never   Smokeless tobacco: Never  Vaping Use   Vaping Use: Never used  Substance Use Topics   Alcohol use: No   Drug use: No    Home Medications Prior to Admission medications   Medication Sig Start  Date End Date Taking? Authorizing Provider  acetaminophen (TYLENOL) 500 MG tablet Take 1,000 mg by mouth every 6 (six) hours as needed for moderate pain or mild pain.   Yes [provider]  ibuprofen (ADVIL) 600 MG tablet Take 1 tablet (600 mg total) by mouth 4 (four) times daily. 10/21/20  Yes Antony Madura, PA-C  norethindrone-ethinyl estradiol (JUNEL FE 1/20) 1-20 MG-MCG tablet Take 1 tablet by mouth daily. 06/08/20  Yes Schuman, Christanna R, MD  ondansetron (ZOFRAN ODT) 4 MG disintegrating tablet Take 1 tablet (4 mg total) by mouth every 8 (eight) hours as needed for nausea or vomiting. 10/21/20  Yes Antony Madura, PA-C  oxyCODONE-acetaminophen (PERCOCET/ROXICET) 5-325 MG tablet Take 1-2 tablets by mouth every 6 (six) hours as needed for severe pain. 10/21/20  Yes Antony Madura, PA-C    Allergies    Patient has no known allergies.  Review of Systems   Review of Systems  Gastrointestinal:  Positive for abdominal pain.  Ten systems reviewed and are negative for acute change, except as noted in the HPI.    Physical Exam Updated Vital  Signs BP 132/79   Pulse 68   Temp 98.9 F (37.2 C) (Oral)   Resp 18   LMP 10/02/2020   SpO2 99%   Physical Exam Vitals and nursing note reviewed.  Constitutional:      General: She is not in acute distress.    Appearance: She is well-developed. She is not diaphoretic.     Comments: Patient hunched over in emesis bag actively vomiting  HENT:     Head: Normocephalic and atraumatic.  Eyes:     General: No scleral icterus.    Conjunctiva/sclera: Conjunctivae normal.  Cardiovascular:     Rate and Rhythm: Normal rate and regular rhythm.     Pulses: Normal pulses.  Pulmonary:     Effort: Pulmonary effort is normal. No respiratory distress.     Comments: Respirations even and unlabored Abdominal:     Palpations: Abdomen is soft. There is no mass.     Tenderness: There is abdominal tenderness.     Hernia: No hernia is present.     Comments:  Focal right lower quadrant tenderness to palpation.  Abdomen soft, nondistended.  No peritoneal signs.  Musculoskeletal:        General: Normal range of motion.     Cervical back: Normal range of motion.  Skin:    General: Skin is warm and dry.     Coloration: Skin is not pale.     Findings: No erythema or rash.  Neurological:     Mental Status: She is alert and oriented to person, place, and time.     Coordination: Coordination normal.  Psychiatric:        Behavior: Behavior normal.    ED Results / Procedures / Treatments   Labs (all labs ordered are listed, but only abnormal results are displayed) Labs Reviewed  COMPREHENSIVE METABOLIC PANEL - Abnormal; Notable for the following components:      Result Value   Creatinine, Ser 1.09 (*)    All other components within normal limits  CBC - Abnormal; Notable for the following components:   WBC 11.2 (*)    RBC 5.12 (*)    All other components within normal limits  URINALYSIS, ROUTINE W REFLEX MICROSCOPIC - Abnormal; Notable for the following components:   Color, Urine STRAW (*)    Hgb urine dipstick SMALL (*)    Bacteria, UA RARE (*)    All other components within normal limits  LIPASE, BLOOD  I-STAT BETA HCG BLOOD, ED ([facility], [facility], [facility] ONLY)    EKG None  Radiology CT ABDOMEN PELVIS W CONTRAST  Result Date: 10/21/2020 CLINICAL DATA:  Right lower quadrant abdominal pain EXAM: CT ABDOMEN AND PELVIS WITH CONTRAST TECHNIQUE: Multidetector CT imaging of the abdomen and pelvis was performed using the standard protocol following bolus administration of intravenous contrast. CONTRAST:  80mL OMNIPAQUE IOHEXOL 350 MG/ML SOLN COMPARISON:  Same-day pelvic ultrasound FINDINGS: Lower chest: Lung bases are clear. Normal heart size. No pericardial effusion. Hepatobiliary: No worrisome focal liver abnormality is seen. Normal gallbladder. No visible calcified gallstones. No biliary ductal dilatation. Pancreas: No pancreatic ductal dilatation or  surrounding inflammatory changes. Spleen: Normal in size. No concerning splenic lesions. Adrenals/Urinary Tract: Normal adrenals. Asymmetrically delayed right renal nephrogram with moderate hydroureteronephrosis to the level of a punctate 1-2 mm calculus at the level of the right ureterovesicular junction (2/75, 5/84). Some associated perinephric and periureteral stranding is present. No left urinary tract dilatation. No concerning renal mass. Urinary bladder is largely decompressed at the time of  exam and therefore poorly evaluated by CT imaging. No other gross bladder abnormality accounting for underdistention. Stomach/Bowel: Distal esophagus, stomach and duodenal sweep are unremarkable. No small bowel wall thickening or dilatation. No evidence of obstruction. A normal appendix is visualized. No colonic dilatation or wall thickening. Vascular/Lymphatic: No significant vascular findings are present. No enlarged abdominal or pelvic lymph nodes. Reproductive: Anteverted uterus. Simple appearing dominant cyst/follicle in the right ovary measuring up to 1.5 cm in size. No concerning adnexal mass. Other: No abdominal wall hernia or abnormality. No abdominopelvic ascites. Musculoskeletal: No acute osseous abnormality or suspicious osseous lesion. IMPRESSION: 1. Moderate right hydroureteronephrosis to the level a 1-2 mm punctate calculus positioned at the right ureterovesicular junction. Some associated perinephric and periureteral stranding with delayed right renal nephrogram are compatible with obstructive uropathy though should could correlate with urinalysis to exclude superimposed urinary tract infection if there is clinical concern. 2. Simple appearing 1.5 cm cyst in the right adnexa, likely benign functional cyst/follicle as characterized on recent ultrasound. No follow-up imaging recommended. Note: This recommendation does not apply to patients with increased risk (genetic, family history, elevated tumor markers  or other high-risk factors) of ovarian cancer. Reference: JACR 2020 Feb; 17(2):248-254. Electronically Signed   By: Kreg Shropshire M.D.   On: 10/21/2020 02:27   US PELVIC COMPLETE W TRANSVAGINAL AND TORSION R/O  Result Date: 10/21/2020 CLINICAL DATA:  Right lower quadrant pain for several hours with nausea and vomiting EXAM: TRANSABDOMINAL AND TRANSVAGINAL ULTRASOUND OF PELVIS DOPPLER ULTRASOUND OF OVARIES TECHNIQUE: Both transabdominal and transvaginal ultrasound examinations of the pelvis were performed. Transabdominal technique was performed for global imaging of the pelvis including uterus, ovaries, adnexal regions, and pelvic cul-de-sac. It was necessary to proceed with endovaginal exam following the transabdominal exam to visualize the ovaries. Color and duplex Doppler ultrasound was utilized to evaluate blood flow to the ovaries. COMPARISON:  None. FINDINGS: Uterus Measurements: 7.5 x 3.2 x 3.8 cm. = volume: 48 mL. No fibroids or other mass visualized. Endometrium Thickness: 1.6 mm.  No focal abnormality visualized. Right ovary Measurements: 2.7 x 2.5 x 2.2 cm. = volume: 7.5 mL. 1.9 cm dominant follicle is noted. Left ovary Measurements: 2.3 x 1.7 x 1.6 cm. = volume: 3.2 mL. Normal appearance/no adnexal mass. Pulsed Doppler evaluation of both ovaries demonstrates normal low-resistance arterial and venous waveforms. Other findings No abnormal free fluid. IMPRESSION: 1.9 cm dominant follicle within the right ovary. No followup imaging recommended. Note: This recommendation does not apply to premenarchal patients or to those with increased risk (genetic, family history, elevated tumor markers or other high-risk factors) of ovarian cancer. Reference: Radiology 2019 Nov; 293(2):359-371. No other focal abnormality is noted. Electronically Signed   By: Alcide Clever M.D.   On: 10/21/2020 01:10    Procedures Procedures   Medications Ordered in ED Medications  promethazine (PHENERGAN) 12.5 mg in sodium  chloride 0.9 % 50 mL IVPB (0 mg Intravenous Stopped 10/21/20 0309)  morphine 4 MG/ML injection 4 mg (4 mg Intravenous Given 10/20/20 2353)  ondansetron (ZOFRAN) injection 4 mg (4 mg Intravenous Given 10/20/20 2353)  sodium chloride 0.9 % bolus 1,000 mL (0 mLs Intravenous Stopped 10/21/20 0309)  HYDROmorphone (DILAUDID) injection 1 mg (1 mg Intravenous Given 10/21/20 0109)  iohexol (OMNIPAQUE) 350 MG/ML injection 80 mL (80 mLs Intravenous Contrast Given 10/21/20 0202)  sodium chloride (PF) 0.9 % injection (  Contrast Given 10/21/20 0210)  ketorolac (TORADOL) 15 MG/ML injection 15 mg (15 mg Intravenous Given 10/21/20 0240)  ED Course  I have reviewed the triage vital signs and the nursing notes.  Pertinent labs & imaging results that were available during my care of the patient were reviewed by me and considered in my medical decision making (see chart for details).  Clinical Course as of 10/21/20 0404  Sat Oct 20, 2020  2335 Spoke with Revonda Standard of ultrasound who is en route to image the patient to rule out ovarian torsion. [KH]  Sun Oct 21, 2020  0106 Per RN, pain is persistent and worsening despite morphine.  Ordered to receive 1 mg IV Dilaudid.  Will give Phenergan for persistent nausea and ongoing vomiting.  Pending ultrasound results. [KH]  0255 Presently pain-free.  Reports that she is feeling much more comfortable.  Discussed findings of her imaging which show obstructing 1 to 2 mm kidney stone on the right side.  She incidentally has a right ovarian cyst, though this does not appear hemorrhagic and there is no evidence of ovarian torsion on ultrasound. [KH]    Clinical Course User Index [KH] Antony Madura, PA-C   MDM Rules/Calculators/A&P                          Patient has been diagnosed with a kidney stone via CT. There is no evidence of significant hydronephrosis, serum creatine WNL, vitals sign stable and the pt does not have irratractable vomiting. She will be discharged home with pain  medications and has been advised to follow up with Urology if symptoms persist. Return precautions discussed and provided. Patient discharged in stable condition with no unaddressed concerns.    Final Clinical Impression(s) / ED Diagnoses Final diagnoses:  RLQ abdominal pain  Kidney stone    Rx / DC Orders ED Discharge Orders          Ordered    ibuprofen (ADVIL) 600 MG tablet  4 times daily        10/21/20 0303    oxyCODONE-acetaminophen (PERCOCET/ROXICET) 5-325 MG tablet  Every 6 hours PRN        10/21/20 0303    ondansetron (ZOFRAN ODT) 4 MG disintegrating tablet  Every 8 hours PRN        10/21/20 0303             Antony Madura, PA-C 10/21/20 0405    Palumbo, April, MD 10/21/20 0510

## 2020-10-21 ENCOUNTER — Encounter (HOSPITAL_COMMUNITY): Payer: Self-pay

## 2020-10-21 ENCOUNTER — Emergency Department (HOSPITAL_COMMUNITY): Payer: BC Managed Care – PPO

## 2020-10-21 DIAGNOSIS — R1031 Right lower quadrant pain: Secondary | ICD-10-CM | POA: Diagnosis not present

## 2020-10-21 DIAGNOSIS — N8301 Follicular cyst of right ovary: Secondary | ICD-10-CM | POA: Diagnosis not present

## 2020-10-21 DIAGNOSIS — R109 Unspecified abdominal pain: Secondary | ICD-10-CM | POA: Diagnosis not present

## 2020-10-21 DIAGNOSIS — R112 Nausea with vomiting, unspecified: Secondary | ICD-10-CM | POA: Diagnosis not present

## 2020-10-21 MED ORDER — KETOROLAC TROMETHAMINE 15 MG/ML IJ SOLN
15.0000 mg | Freq: Once | INTRAMUSCULAR | Status: AC
  Administered 2020-10-21: 15 mg via INTRAVENOUS
  Filled 2020-10-21: qty 1

## 2020-10-21 MED ORDER — IBUPROFEN 600 MG PO TABS: 600.0000 mg | ORAL_TABLET | Freq: Four times a day (QID) | ORAL | 0 refills | Status: AC

## 2020-10-21 MED ORDER — OXYCODONE-ACETAMINOPHEN 5-325 MG PO TABS: 1.0000 | ORAL_TABLET | Freq: Four times a day (QID) | ORAL | 0 refills | Status: AC | PRN

## 2020-10-21 MED ORDER — SODIUM CHLORIDE 0.9 % IV SOLN
12.5000 mg | Freq: Four times a day (QID) | INTRAVENOUS | Status: DC | PRN
  Administered 2020-10-21: 12.5 mg via INTRAVENOUS
  Filled 2020-10-21: qty 12.5

## 2020-10-21 MED ORDER — ONDANSETRON 4 MG PO TBDP: 4.0000 mg | ORAL_TABLET | Freq: Three times a day (TID) | ORAL | 0 refills | Status: AC | PRN

## 2020-10-21 MED ORDER — HYDROMORPHONE HCL 1 MG/ML IJ SOLN
1.0000 mg | Freq: Once | INTRAMUSCULAR | Status: AC
  Administered 2020-10-21: 1 mg via INTRAVENOUS
  Filled 2020-10-21: qty 1

## 2020-10-21 MED ORDER — IOHEXOL 350 MG/ML SOLN
80.0000 mL | Freq: Once | INTRAVENOUS | Status: AC | PRN
  Administered 2020-10-21: 80 mL via INTRAVENOUS

## 2020-10-21 MED ORDER — SODIUM CHLORIDE (PF) 0.9 % IJ SOLN
INTRAMUSCULAR | Status: AC
  Filled 2020-10-21: qty 50

## 2020-10-21 NOTE — ED Notes (Signed)
Patient discharged with boyfriend. No concerns at this time.

## 2020-10-21 NOTE — Discharge Instructions (Addendum)
Take ibuprofen as prescribed. You have also been prescribed Percocet to take for management of pain.  Do not drive or drink alcohol after taking this medication as it may make you drowsy and impair your judgment.  f you continue to have persistent pain from your kidney stone, we advise follow-up with urology.  You may also return to the ED for new or concerning symptoms.

## 2020-11-15 DIAGNOSIS — N201 Calculus of ureter: Secondary | ICD-10-CM | POA: Diagnosis not present

## 2021-03-15 DIAGNOSIS — Z3201 Encounter for pregnancy test, result positive: Secondary | ICD-10-CM | POA: Diagnosis not present

## 2021-04-09 ENCOUNTER — Encounter: Payer: BC Managed Care – PPO | Admitting: Obstetrics

## 2021-04-16 DIAGNOSIS — Z3687 Encounter for antenatal screening for uncertain dates: Secondary | ICD-10-CM | POA: Diagnosis not present

## 2021-04-16 DIAGNOSIS — Z369 Encounter for antenatal screening, unspecified: Secondary | ICD-10-CM | POA: Diagnosis not present

## 2021-04-16 DIAGNOSIS — Z3689 Encounter for other specified antenatal screening: Secondary | ICD-10-CM | POA: Diagnosis not present

## 2021-04-16 DIAGNOSIS — Z3A08 8 weeks gestation of pregnancy: Secondary | ICD-10-CM | POA: Diagnosis not present

## 2021-04-16 DIAGNOSIS — Z3401 Encounter for supervision of normal first pregnancy, first trimester: Secondary | ICD-10-CM | POA: Diagnosis not present

## 2021-04-17 DIAGNOSIS — Z369 Encounter for antenatal screening, unspecified: Secondary | ICD-10-CM | POA: Diagnosis not present

## 2021-05-17 DIAGNOSIS — Z113 Encounter for screening for infections with a predominantly sexual mode of transmission: Secondary | ICD-10-CM | POA: Diagnosis not present

## 2021-05-17 DIAGNOSIS — Z124 Encounter for screening for malignant neoplasm of cervix: Secondary | ICD-10-CM | POA: Diagnosis not present

## 2021-06-07 DIAGNOSIS — Z361 Encounter for antenatal screening for raised alphafetoprotein level: Secondary | ICD-10-CM | POA: Diagnosis not present

## 2021-07-05 DIAGNOSIS — Z363 Encounter for antenatal screening for malformations: Secondary | ICD-10-CM | POA: Diagnosis not present

## 2021-07-05 DIAGNOSIS — Z3A20 20 weeks gestation of pregnancy: Secondary | ICD-10-CM | POA: Diagnosis not present

## 2021-07-29 DIAGNOSIS — Z331 Pregnant state, incidental: Secondary | ICD-10-CM | POA: Diagnosis not present

## 2021-07-29 DIAGNOSIS — N760 Acute vaginitis: Secondary | ICD-10-CM | POA: Diagnosis not present

## 2021-08-02 DIAGNOSIS — Z361 Encounter for antenatal screening for raised alphafetoprotein level: Secondary | ICD-10-CM | POA: Diagnosis not present

## 2021-08-07 DIAGNOSIS — O99810 Abnormal glucose complicating pregnancy: Secondary | ICD-10-CM | POA: Diagnosis not present

## 2021-08-29 DIAGNOSIS — R293 Abnormal posture: Secondary | ICD-10-CM | POA: Diagnosis not present

## 2021-08-29 DIAGNOSIS — M9903 Segmental and somatic dysfunction of lumbar region: Secondary | ICD-10-CM | POA: Diagnosis not present

## 2021-08-29 DIAGNOSIS — M9902 Segmental and somatic dysfunction of thoracic region: Secondary | ICD-10-CM | POA: Diagnosis not present

## 2021-08-29 DIAGNOSIS — M62830 Muscle spasm of back: Secondary | ICD-10-CM | POA: Diagnosis not present

## 2021-08-30 DIAGNOSIS — O360130 Maternal care for anti-D [Rh] antibodies, third trimester, not applicable or unspecified: Secondary | ICD-10-CM | POA: Diagnosis not present

## 2021-08-30 DIAGNOSIS — Z23 Encounter for immunization: Secondary | ICD-10-CM | POA: Diagnosis not present

## 2021-09-03 DIAGNOSIS — M62830 Muscle spasm of back: Secondary | ICD-10-CM | POA: Diagnosis not present

## 2021-09-03 DIAGNOSIS — R293 Abnormal posture: Secondary | ICD-10-CM | POA: Diagnosis not present

## 2021-09-03 DIAGNOSIS — M9903 Segmental and somatic dysfunction of lumbar region: Secondary | ICD-10-CM | POA: Diagnosis not present

## 2021-09-03 DIAGNOSIS — M9902 Segmental and somatic dysfunction of thoracic region: Secondary | ICD-10-CM | POA: Diagnosis not present

## 2021-10-19 DIAGNOSIS — O368139 Decreased fetal movements, third trimester, other fetus: Secondary | ICD-10-CM | POA: Diagnosis not present

## 2021-10-19 DIAGNOSIS — O368190 Decreased fetal movements, unspecified trimester, not applicable or unspecified: Secondary | ICD-10-CM | POA: Diagnosis not present

## 2021-10-19 DIAGNOSIS — O368130 Decreased fetal movements, third trimester, not applicable or unspecified: Secondary | ICD-10-CM | POA: Diagnosis not present

## 2021-10-19 DIAGNOSIS — Z3A35 35 weeks gestation of pregnancy: Secondary | ICD-10-CM | POA: Diagnosis not present

## 2021-10-19 DIAGNOSIS — Z79899 Other long term (current) drug therapy: Secondary | ICD-10-CM | POA: Diagnosis not present

## 2021-10-25 DIAGNOSIS — Z369 Encounter for antenatal screening, unspecified: Secondary | ICD-10-CM | POA: Diagnosis not present

## 2021-10-31 DIAGNOSIS — O321XX1 Maternal care for breech presentation, fetus 1: Secondary | ICD-10-CM | POA: Diagnosis not present

## 2021-10-31 DIAGNOSIS — Z3A37 37 weeks gestation of pregnancy: Secondary | ICD-10-CM | POA: Diagnosis not present

## 2021-11-14 DIAGNOSIS — O321XX0 Maternal care for breech presentation, not applicable or unspecified: Secondary | ICD-10-CM | POA: Diagnosis not present

## 2021-11-14 DIAGNOSIS — L02422 Furuncle of left axilla: Secondary | ICD-10-CM | POA: Diagnosis not present

## 2021-11-14 DIAGNOSIS — O9972 Diseases of the skin and subcutaneous tissue complicating childbirth: Secondary | ICD-10-CM | POA: Diagnosis not present

## 2021-11-14 DIAGNOSIS — D252 Subserosal leiomyoma of uterus: Secondary | ICD-10-CM | POA: Diagnosis not present

## 2021-11-14 DIAGNOSIS — O3413 Maternal care for benign tumor of corpus uteri, third trimester: Secondary | ICD-10-CM | POA: Diagnosis not present

## 2021-11-14 DIAGNOSIS — O1404 Mild to moderate pre-eclampsia, complicating childbirth: Secondary | ICD-10-CM | POA: Diagnosis not present

## 2021-11-14 DIAGNOSIS — Z3A39 39 weeks gestation of pregnancy: Secondary | ICD-10-CM | POA: Diagnosis not present

## 2021-12-20 DIAGNOSIS — D485 Neoplasm of uncertain behavior of skin: Secondary | ICD-10-CM | POA: Diagnosis not present

## 2021-12-20 DIAGNOSIS — C44629 Squamous cell carcinoma of skin of left upper limb, including shoulder: Secondary | ICD-10-CM | POA: Diagnosis not present

## 2021-12-23 DIAGNOSIS — K649 Unspecified hemorrhoids: Secondary | ICD-10-CM | POA: Diagnosis not present

## 2021-12-23 DIAGNOSIS — Z309 Encounter for contraceptive management, unspecified: Secondary | ICD-10-CM | POA: Diagnosis not present

## 2022-02-03 DIAGNOSIS — C44629 Squamous cell carcinoma of skin of left upper limb, including shoulder: Secondary | ICD-10-CM | POA: Diagnosis not present

## 2022-02-03 DIAGNOSIS — Z7189 Other specified counseling: Secondary | ICD-10-CM | POA: Diagnosis not present

## 2022-02-18 DIAGNOSIS — R1032 Left lower quadrant pain: Secondary | ICD-10-CM | POA: Diagnosis not present

## 2022-02-18 DIAGNOSIS — N200 Calculus of kidney: Secondary | ICD-10-CM | POA: Diagnosis not present

## 2022-02-18 DIAGNOSIS — R1084 Generalized abdominal pain: Secondary | ICD-10-CM | POA: Diagnosis not present

## 2022-02-26 DIAGNOSIS — K529 Noninfective gastroenteritis and colitis, unspecified: Secondary | ICD-10-CM | POA: Diagnosis not present

## 2022-02-26 DIAGNOSIS — Z87448 Personal history of other diseases of urinary system: Secondary | ICD-10-CM | POA: Diagnosis not present

## 2022-02-26 DIAGNOSIS — D3502 Benign neoplasm of left adrenal gland: Secondary | ICD-10-CM | POA: Diagnosis not present

## 2022-02-26 DIAGNOSIS — Z136 Encounter for screening for cardiovascular disorders: Secondary | ICD-10-CM | POA: Diagnosis not present

## 2022-02-26 DIAGNOSIS — Z85828 Personal history of other malignant neoplasm of skin: Secondary | ICD-10-CM | POA: Diagnosis not present

## 2022-02-26 DIAGNOSIS — Z131 Encounter for screening for diabetes mellitus: Secondary | ICD-10-CM | POA: Diagnosis not present

## 2022-02-26 DIAGNOSIS — R7989 Other specified abnormal findings of blood chemistry: Secondary | ICD-10-CM | POA: Diagnosis not present

## 2022-02-26 DIAGNOSIS — N200 Calculus of kidney: Secondary | ICD-10-CM | POA: Diagnosis not present

## 2022-03-14 ENCOUNTER — Ambulatory Visit: Payer: BC Managed Care – PPO | Admitting: Podiatry

## 2022-03-14 DIAGNOSIS — L600 Ingrowing nail: Secondary | ICD-10-CM

## 2022-03-14 NOTE — Progress Notes (Signed)
   Chief Complaint  Patient presents with   Ingrown Toenail    Patient is here for right foot great toe ingrown that she has had for 1 month and she also states that she noticed white spots on bilateral great toes.    Subjective: Patient presents today for evaluation of pain to the lateral border of the right great toe that is been going on for about 6 weeks now. Patient is concerned for possible ingrown nail.  It is very sensitive to touch.  Patient presents today for further treatment and evaluation.  Past Medical History:  Diagnosis Date   Acne    Cardiac murmur    Dysmenorrhea    Eczema    Vaccine for human papilloma virus (HPV) types 6, 11, 16, and 18 administered    series completed    Objective:  General: Well developed, nourished, in no acute distress, alert and oriented x3   Dermatology: Skin is warm, dry and supple bilateral.  Lateral border right great toe is tender with evidence of an ingrowing nail. Pain on palpation noted to the border of the nail fold. The remaining nails appear unremarkable at this time. There are no open sores, lesions.  Vascular: DP and PT pulses palpable.  No clinical evidence of vascular compromise  Neruologic: Grossly intact via light touch bilateral.  Musculoskeletal: No pedal deformity noted  Assesement: #1 Paronychia with ingrowing nail lateral border right great toe  Plan of Care:  1. Patient evaluated.  2. Discussed treatment alternatives and plan of care. Explained nail avulsion procedure and post procedure course to patient. 3. Patient opted for permanent partial nail avulsion of the ingrown portion of the nail.  4. Prior to procedure, local anesthesia infiltration utilized using 3 ml of a 50:50 mixture of 2% plain lidocaine and 0.5% plain marcaine in a normal hallux block fashion and a betadine prep performed.  5. Partial permanent nail avulsion with chemical matrixectomy performed using 3x30sec applications of phenol followed by  alcohol flush.  6. Light dressing applied.  Post care instructions provided 7.  Recommend triple antibiotic ointment and Band-Aid daily 8.  Return to clinic as needed  Felecia Shelling, DPM [facility]  Dr. Felecia Shelling, DPM    [address] [place], [place] [address]                Office [phone]  Fax [phone]

## 2022-04-09 DIAGNOSIS — Z111 Encounter for screening for respiratory tuberculosis: Secondary | ICD-10-CM | POA: Diagnosis not present

## 2022-04-09 DIAGNOSIS — Z029 Encounter for administrative examinations, unspecified: Secondary | ICD-10-CM | POA: Diagnosis not present

## 2022-05-13 ENCOUNTER — Other Ambulatory Visit: Payer: Self-pay | Admitting: Urology

## 2022-07-31 NOTE — Telephone Encounter (Signed)
Open encounter in error..

## 2023-07-05 IMAGING — US US PELVIS COMPLETE TRANSABD/TRANSVAG W DUPLEX
1 series · 13 of 25 positions shown · non-contrast
Comparison: None.

CLINICAL DATA: Right lower quadrant pain for several hours with
nausea and vomiting

EXAM:
TRANSABDOMINAL AND TRANSVAGINAL ULTRASOUND OF PELVIS
DOPPLER ULTRASOUND OF OVARIES
TECHNIQUE: Both transabdominal and transvaginal ultrasound examinations of the
pelvis were performed. Transabdominal technique was performed for
global imaging of the pelvis including uterus, ovaries, adnexal
regions, and pelvic cul-de-sac.
It was necessary to proceed with endovaginal exam following the
transabdominal exam to visualize the ovaries. Color and duplex
Doppler ultrasound was utilized to evaluate blood flow to the
ovaries.

[Series 2: us pelvis complete transabd/transvag w duplex · 13 of 181 slices shown]
[im 1/181]
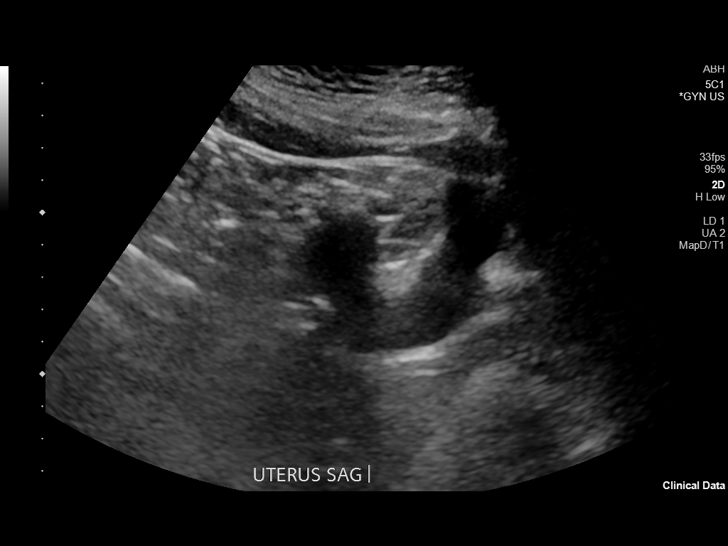
[im 16/181]
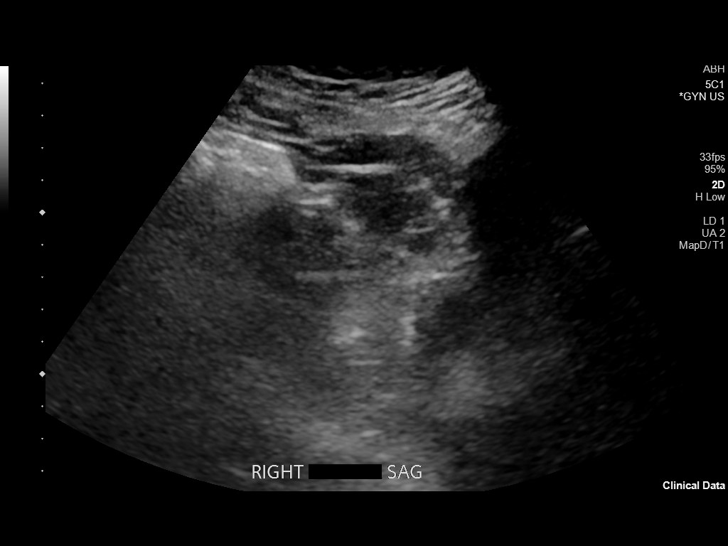
[im 31/181]
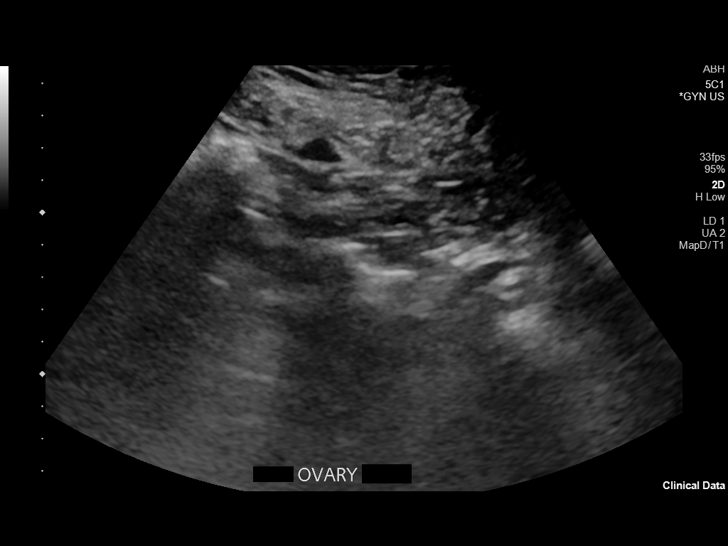
[im 46/181]
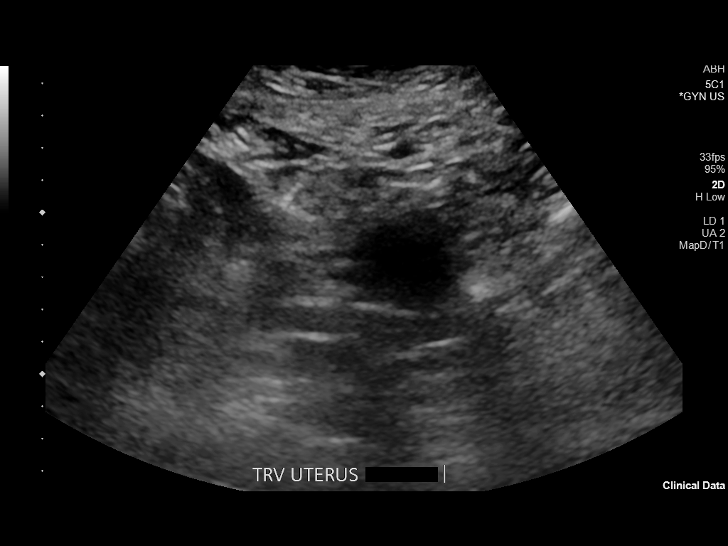
[im 61/181]
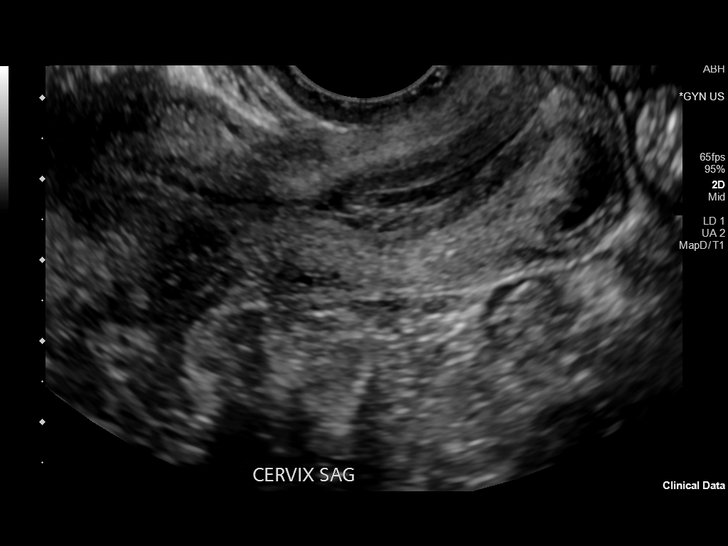
[im 76/181]
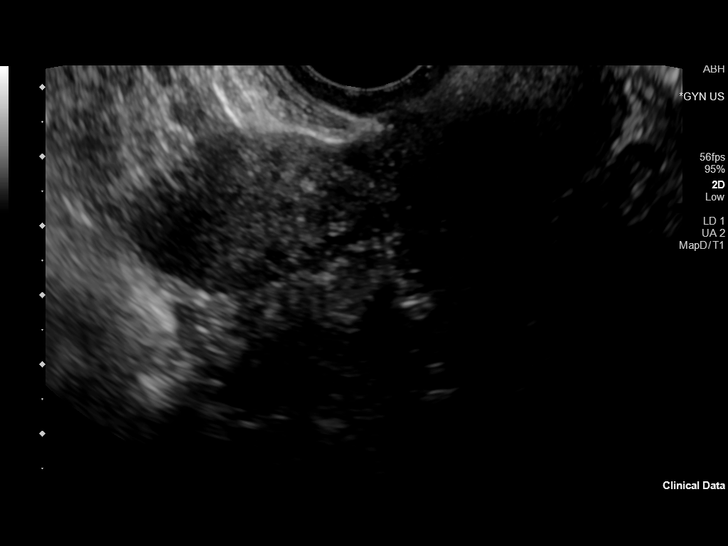
[im 91/181]
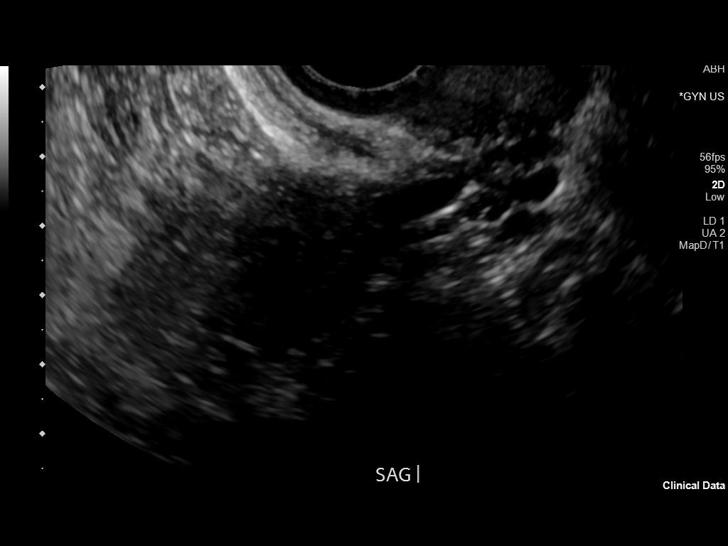
[im 106/181]
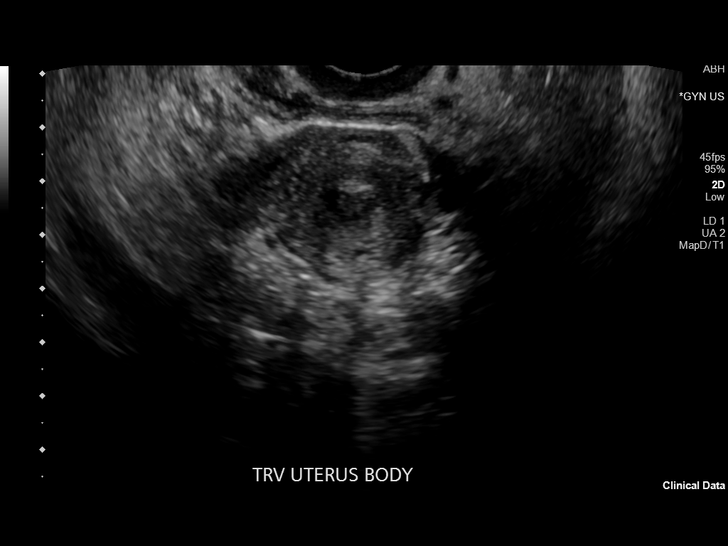
[im 121/181]
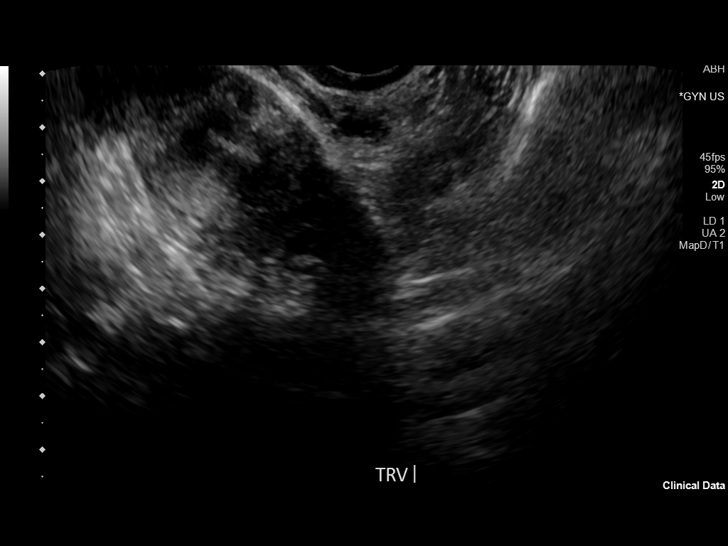
[im 136/181]
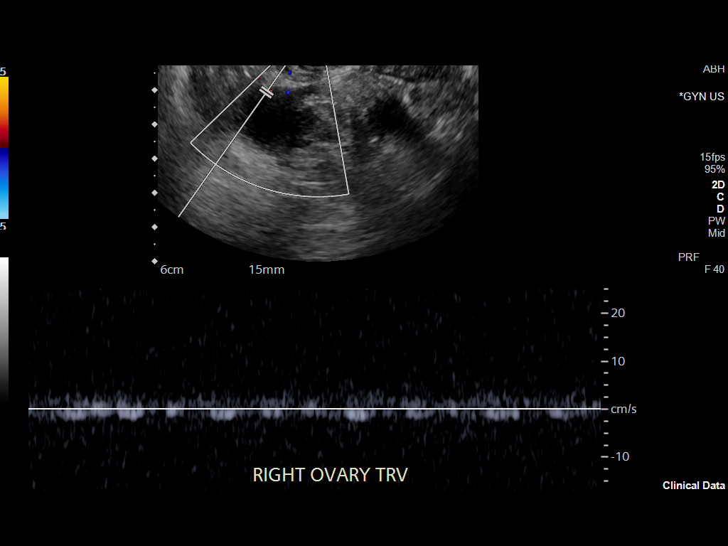
[im 151/181]
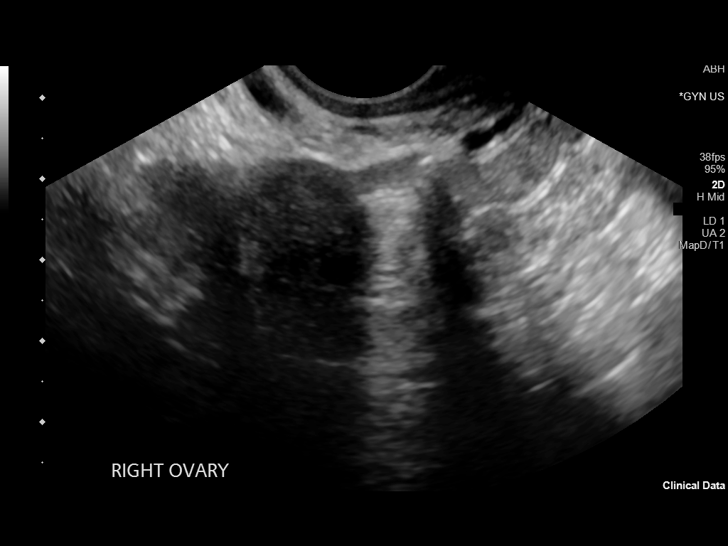
[im 166/181]
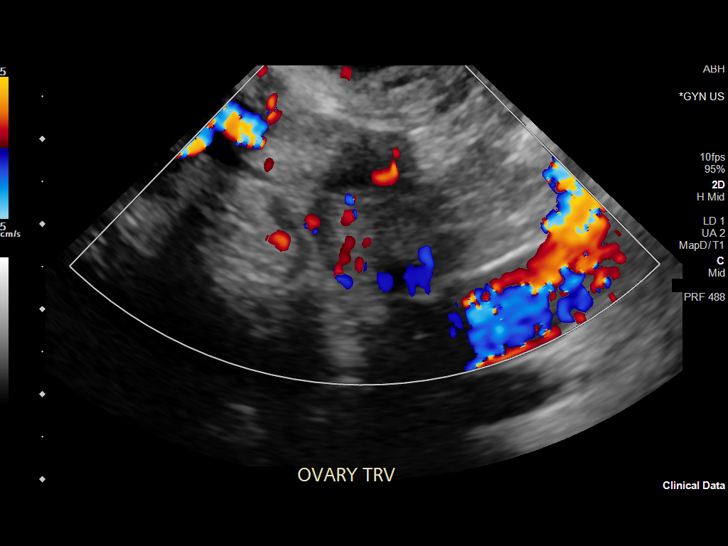
[im 181/181]
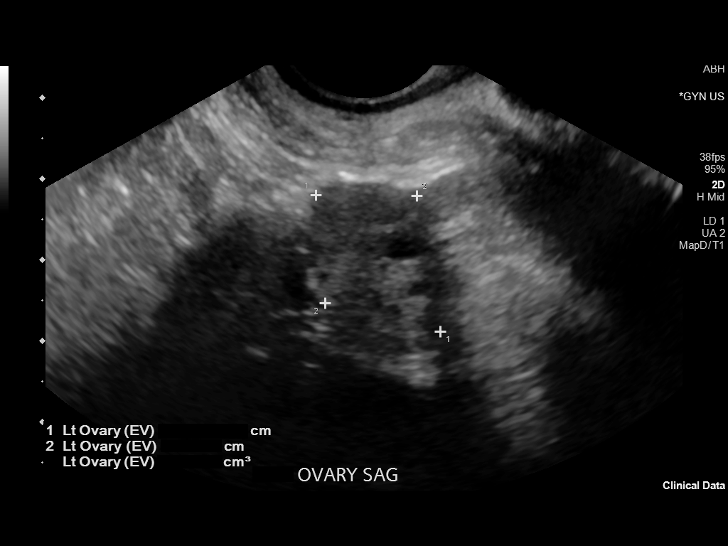

[13 of 25 positions shown; findings below may reference images not displayed]

FINDINGS: Uterus

Measurements: 7.5 x 3.2 x 3.8 cm. = volume: 48 mL. No fibroids or
other mass visualized.

Endometrium

Thickness: 1.6 mm.  No focal abnormality visualized.

Right ovary

Measurements: 2.7 x 2.5 x 2.2 cm. = volume: 7.5 mL. 1.9 cm dominant
follicle is noted.

Left ovary

Measurements: 2.3 x 1.7 x 1.6 cm. = volume: 3.2 mL. Normal
appearance/no adnexal mass.

Pulsed Doppler evaluation of both ovaries demonstrates normal
low-resistance arterial and venous waveforms.

Other findings

No abnormal free fluid.
IMPRESSION: 1.9 cm dominant follicle within the right ovary. No followup imaging
recommended.

Note: This recommendation does not apply to premenarchal patients or
to those with increased risk (genetic, family history, elevated
tumor markers or other high-risk factors) of ovarian cancer.
Reference: Radiology [DATE]):[id number].

No other focal abnormality is noted.

## 2023-11-16 ENCOUNTER — Ambulatory Visit: Admitting: Podiatry

## 2023-11-17 ENCOUNTER — Ambulatory Visit: Admitting: Podiatry
